# Patient Record
Sex: Female | Born: 1990 | Race: White | Hispanic: No | Marital: Married | Smoking: Never smoker
Health system: Southern US, Community
[De-identification: ages and names within clinical notes are randomized; demographics above are authoritative.]

## PROBLEM LIST (undated history)

## (undated) DIAGNOSIS — Z789 Other specified health status: Secondary | ICD-10-CM

## (undated) HISTORY — PX: NO PAST SURGERIES: SHX2092

## (undated) HISTORY — PX: HIP ARTHROPLASTY: SHX981

## (undated) HISTORY — PX: ESOPHAGEAL DILATION: SHX303

---
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
## 2010-04-18 ENCOUNTER — Emergency Department (HOSPITAL_COMMUNITY): Admission: EM | Admit: 2010-04-18 | Discharge: 2010-04-18 | Payer: Self-pay | Admitting: Family Medicine

## 2010-08-24 ENCOUNTER — Encounter: Payer: Self-pay | Admitting: Internal Medicine

## 2010-10-22 LAB — URINALYSIS, ROUTINE W REFLEX MICROSCOPIC
Bilirubin Urine: NEGATIVE
Hgb urine dipstick: NEGATIVE
Protein, ur: NEGATIVE mg/dL
Urobilinogen, UA: 0.2 mg/dL (ref 0.0–1.0)

## 2010-10-22 LAB — POCT I-STAT, CHEM 8
HCT: 35 % — ABNORMAL LOW (ref 36.0–46.0)
Hemoglobin: 11.9 g/dL — ABNORMAL LOW (ref 12.0–15.0)
Potassium: 3.8 mEq/L (ref 3.5–5.1)
Sodium: 138 mEq/L (ref 135–145)

## 2010-10-22 LAB — BASIC METABOLIC PANEL
BUN: 10 mg/dL (ref 6–23)
CO2: 25 mEq/L (ref 19–32)
Calcium: 9.2 mg/dL (ref 8.4–10.5)
Chloride: 105 mEq/L (ref 96–112)
Creatinine, Ser: 0.77 mg/dL (ref 0.4–1.2)
Glucose, Bld: 82 mg/dL (ref 70–99)

## 2010-10-22 LAB — CBC
MCHC: 34.4 g/dL (ref 30.0–36.0)
MCV: 86.1 fL (ref 78.0–100.0)
RBC: 4.05 MIL/uL (ref 3.87–5.11)
RDW: 14.1 % (ref 11.5–15.5)

## 2010-10-22 LAB — DIFFERENTIAL
Basophils Absolute: 0 10*3/uL (ref 0.0–0.1)
Basophils Relative: 0 % (ref 0–1)
Eosinophils Absolute: 0.3 10*3/uL (ref 0.0–0.7)
Monocytes Relative: 7 % (ref 3–12)
Neutro Abs: 4.8 10*3/uL (ref 1.7–7.7)
Neutrophils Relative %: 63 % (ref 43–77)

## 2010-10-22 LAB — WET PREP, GENITAL: Trich, Wet Prep: NONE SEEN

## 2010-10-22 LAB — GC/CHLAMYDIA PROBE AMP, GENITAL: Chlamydia, DNA Probe: POSITIVE — AB

## 2013-07-18 LAB — OB RESULTS CONSOLE GC/CHLAMYDIA
Chlamydia: NEGATIVE
GC PROBE AMP, GENITAL: NEGATIVE

## 2013-08-03 NOTE — L&D Delivery Note (Signed)
Delivery Note Patient was complete/ Complete / +2 and pushed for 10 minutes.  At 4:43 PM a viable and healthy female was delivered via Vaginal, Spontaneous Delivery (Presentation: Right Occiput Anterior).  APGAR: 8, 9; weight .   Placenta status: Intact, Spontaneous.  Cord: 3 vessels with the following complications: None.   Anesthesia: None  Episiotomy: None Lacerations: 2nd degree Suture Repair: 2.0 vicryl Est. Blood Loss (mL): 400mL  Mom to postpartum.  Baby to Couplet care / Skin to Skin.  Essie HartINN, Shavon Zenz [name] 02/09/2014, 5:23 PM

## 2013-09-11 LAB — OB RESULTS CONSOLE HIV ANTIBODY (ROUTINE TESTING): HIV: NONREACTIVE

## 2013-09-11 LAB — OB RESULTS CONSOLE RUBELLA ANTIBODY, IGM: RUBELLA: IMMUNE

## 2013-09-11 LAB — OB RESULTS CONSOLE HEPATITIS B SURFACE ANTIGEN: HEP B S AG: NEGATIVE

## 2013-09-11 LAB — OB RESULTS CONSOLE RPR: RPR: NONREACTIVE

## 2013-12-06 LAB — OB RESULTS CONSOLE ABO/RH: RH Type: NEGATIVE

## 2013-12-06 LAB — OB RESULTS CONSOLE ANTIBODY SCREEN: ANTIBODY SCREEN: NEGATIVE

## 2014-02-08 ENCOUNTER — Encounter (HOSPITAL_COMMUNITY): Payer: Self-pay | Admitting: *Deleted

## 2014-02-08 ENCOUNTER — Inpatient Hospital Stay (HOSPITAL_COMMUNITY)
Admission: AD | Admit: 2014-02-08 | Discharge: 2014-02-11 | DRG: 774 | Disposition: A | Discharge status: Home / Self Care | Payer: BC Managed Care – PPO | Source: Ambulatory Visit | Attending: Obstetrics & Gynecology | Admitting: Obstetrics & Gynecology

## 2014-02-08 DIAGNOSIS — Z3490 Encounter for supervision of normal pregnancy, unspecified, unspecified trimester: Secondary | ICD-10-CM

## 2014-02-08 DIAGNOSIS — IMO0002 Reserved for concepts with insufficient information to code with codable children: Principal | ICD-10-CM | POA: Diagnosis present

## 2014-02-08 DIAGNOSIS — IMO0001 Reserved for inherently not codable concepts without codable children: Secondary | ICD-10-CM

## 2014-02-08 HISTORY — DX: Other specified health status: Z78.9

## 2014-02-08 LAB — CBC
HEMATOCRIT: 31.8 % — AB (ref 36.0–46.0)
Hemoglobin: 10.2 g/dL — ABNORMAL LOW (ref 12.0–15.0)
MCH: 27.3 pg (ref 26.0–34.0)
MCHC: 32.1 g/dL (ref 30.0–36.0)
MCV: 85 fL (ref 78.0–100.0)
PLATELETS: 132 10*3/uL — AB (ref 150–400)
RBC: 3.74 MIL/uL — ABNORMAL LOW (ref 3.87–5.11)
RDW: 14.9 % (ref 11.5–15.5)
WBC: 9.3 10*3/uL (ref 4.0–10.5)

## 2014-02-08 LAB — OB RESULTS CONSOLE GBS: STREP GROUP B AG: NEGATIVE

## 2014-02-08 LAB — GROUP B STREP BY PCR: Group B strep by PCR: NEGATIVE

## 2014-02-08 MED ORDER — OXYTOCIN 40 UNITS IN LACTATED RINGERS INFUSION - SIMPLE MED: 62.5000 mL/h | INTRAVENOUS | Status: DC

## 2014-02-08 MED ORDER — LACTATED RINGERS IV SOLN
INTRAVENOUS | Status: DC
  Administered 2014-02-08 – 2014-02-09 (×3): via INTRAVENOUS

## 2014-02-08 MED ORDER — IBUPROFEN 600 MG PO TABS
600.0000 mg | ORAL_TABLET | Freq: Four times a day (QID) | ORAL | Status: DC | PRN
  Filled 2014-02-08: qty 1

## 2014-02-08 MED ORDER — PROMETHAZINE HCL 25 MG/ML IJ SOLN: 12.5000 mg | Freq: Four times a day (QID) | INTRAMUSCULAR | Status: DC | PRN

## 2014-02-08 MED ORDER — BUTORPHANOL TARTRATE 1 MG/ML IJ SOLN: 2.0000 mg | INTRAMUSCULAR | Status: DC | PRN

## 2014-02-08 MED ORDER — LIDOCAINE HCL (PF) 1 % IJ SOLN
30.0000 mL | INTRAMUSCULAR | Status: AC | PRN
  Administered 2014-02-09: 30 mL via SUBCUTANEOUS
  Filled 2014-02-08: qty 30

## 2014-02-08 MED ORDER — FLEET ENEMA 7-19 GM/118ML RE ENEM: 1.0000 | ENEMA | RECTAL | Status: DC | PRN

## 2014-02-08 MED ORDER — CITRIC ACID-SODIUM CITRATE 334-500 MG/5ML PO SOLN: 30.0000 mL | ORAL | Status: DC | PRN

## 2014-02-08 MED ORDER — ZOLPIDEM TARTRATE 5 MG PO TABS
5.0000 mg | ORAL_TABLET | Freq: Every evening | ORAL | Status: DC | PRN
  Administered 2014-02-08: 5 mg via ORAL
  Filled 2014-02-08: qty 1

## 2014-02-08 MED ORDER — MISOPROSTOL 25 MCG QUARTER TABLET
25.0000 ug | ORAL_TABLET | ORAL | Status: DC | PRN
  Administered 2014-02-08 (×2): 25 ug via VAGINAL
  Filled 2014-02-08: qty 0.25
  Filled 2014-02-08: qty 1
  Filled 2014-02-08: qty 0.25

## 2014-02-08 MED ORDER — ONDANSETRON HCL 4 MG/2ML IJ SOLN
4.0000 mg | Freq: Four times a day (QID) | INTRAMUSCULAR | Status: DC | PRN
  Administered 2014-02-09: 4 mg via INTRAVENOUS
  Filled 2014-02-08: qty 2

## 2014-02-08 MED ORDER — ACETAMINOPHEN 325 MG PO TABS: 650.0000 mg | ORAL_TABLET | ORAL | Status: DC | PRN

## 2014-02-08 MED ORDER — TERBUTALINE SULFATE 1 MG/ML IJ SOLN
0.2500 mg | Freq: Once | INTRAMUSCULAR | Status: AC | PRN
Start: 2014-02-08 — End: 2014-02-08

## 2014-02-08 MED ORDER — LACTATED RINGERS IV SOLN: 500.0000 mL | INTRAVENOUS | Status: DC | PRN

## 2014-02-08 MED ORDER — OXYCODONE-ACETAMINOPHEN 5-325 MG PO TABS: 1.0000 | ORAL_TABLET | ORAL | Status: DC | PRN

## 2014-02-08 MED ORDER — OXYTOCIN BOLUS FROM INFUSION: 500.0000 mL | INTRAVENOUS | Status: DC

## 2014-02-08 NOTE — H&P (Signed)
Pt is a 23 y/o white female G1P0 at 37 weeks who is admitted for an induction for preeclampsia. PNC was uncomplicated until this week when she presented to the office and was found to have an elevated B/P, edema, and proteinuria. She had PIH labs obtained which revealed a plt ct of 128K . Her plt ct at 28 weeks was 147K. She has no headache or vision changes. PMHX: see hollister B/P 138/92 HEENT-wnl ABD-gravid, no RUQ pain FHTs reactive IMP/ IUP at 37 weeks with preeclampsia. ? If plt ct depressed from gest thrombocytopenia or Preeclampsia. Plan/ Will start Induction

## 2014-02-09 ENCOUNTER — Encounter (HOSPITAL_COMMUNITY): Payer: Self-pay | Admitting: *Deleted

## 2014-02-09 DIAGNOSIS — IMO0001 Reserved for inherently not codable concepts without codable children: Secondary | ICD-10-CM

## 2014-02-09 LAB — RPR

## 2014-02-09 MED ORDER — SENNOSIDES-DOCUSATE SODIUM 8.6-50 MG PO TABS
2.0000 | ORAL_TABLET | ORAL | Status: DC
  Administered 2014-02-10 – 2014-02-11 (×2): 2 via ORAL
  Filled 2014-02-09 (×2): qty 2

## 2014-02-09 MED ORDER — FENTANYL CITRATE 0.05 MG/ML IJ SOLN
100.0000 ug | Freq: Once | INTRAMUSCULAR | Status: AC
  Administered 2014-02-09: 100 ug via INTRAVENOUS

## 2014-02-09 MED ORDER — LACTATED RINGERS IV SOLN: INTRAVENOUS | Status: DC

## 2014-02-09 MED ORDER — RHO D IMMUNE GLOBULIN 1500 UNIT/2ML IJ SOSY
300.0000 ug | PREFILLED_SYRINGE | Freq: Once | INTRAMUSCULAR | Status: AC
  Administered 2014-02-10: 300 ug via INTRAMUSCULAR
  Filled 2014-02-09: qty 2

## 2014-02-09 MED ORDER — OXYCODONE-ACETAMINOPHEN 5-325 MG PO TABS
1.0000 | ORAL_TABLET | ORAL | Status: DC | PRN
  Administered 2014-02-10: 1 via ORAL
  Filled 2014-02-09: qty 1

## 2014-02-09 MED ORDER — LANOLIN HYDROUS EX OINT: TOPICAL_OINTMENT | CUTANEOUS | Status: DC | PRN

## 2014-02-09 MED ORDER — ONDANSETRON HCL 4 MG/2ML IJ SOLN: 4.0000 mg | INTRAMUSCULAR | Status: DC | PRN

## 2014-02-09 MED ORDER — OXYTOCIN 40 UNITS IN LACTATED RINGERS INFUSION - SIMPLE MED
1.0000 m[IU]/min | INTRAVENOUS | Status: DC
  Administered 2014-02-09: 2 m[IU]/min via INTRAVENOUS
  Filled 2014-02-09: qty 1000

## 2014-02-09 MED ORDER — FENTANYL CITRATE 0.05 MG/ML IJ SOLN
50.0000 ug | INTRAMUSCULAR | Status: DC | PRN
  Administered 2014-02-09 (×2): 50 ug via INTRAVENOUS
  Filled 2014-02-09 (×3): qty 2

## 2014-02-09 MED ORDER — TETANUS-DIPHTH-ACELL PERTUSSIS 5-2.5-18.5 LF-MCG/0.5 IM SUSP
0.5000 mL | Freq: Once | INTRAMUSCULAR | Status: DC
  Filled 2014-02-09: qty 0.5

## 2014-02-09 MED ORDER — ONDANSETRON HCL 4 MG PO TABS: 4.0000 mg | ORAL_TABLET | ORAL | Status: DC | PRN

## 2014-02-09 MED ORDER — ZOLPIDEM TARTRATE 5 MG PO TABS: 5.0000 mg | ORAL_TABLET | Freq: Every evening | ORAL | Status: DC | PRN

## 2014-02-09 MED ORDER — PRENATAL MULTIVITAMIN CH
1.0000 | ORAL_TABLET | Freq: Every day | ORAL | Status: DC
  Administered 2014-02-10 – 2014-02-11 (×2): 1 via ORAL
  Filled 2014-02-09 (×2): qty 1

## 2014-02-09 MED ORDER — IBUPROFEN 600 MG PO TABS
600.0000 mg | ORAL_TABLET | Freq: Four times a day (QID) | ORAL | Status: DC
  Administered 2014-02-09 – 2014-02-11 (×7): 600 mg via ORAL
  Filled 2014-02-09 (×6): qty 1

## 2014-02-09 MED ORDER — SIMETHICONE 80 MG PO CHEW: 80.0000 mg | CHEWABLE_TABLET | ORAL | Status: DC | PRN

## 2014-02-09 MED ORDER — BENZOCAINE-MENTHOL 20-0.5 % EX AERO
1.0000 "application " | INHALATION_SPRAY | CUTANEOUS | Status: DC | PRN
  Administered 2014-02-10: 1 via TOPICAL
  Filled 2014-02-09: qty 56

## 2014-02-09 MED ORDER — WITCH HAZEL-GLYCERIN EX PADS: 1.0000 "application " | MEDICATED_PAD | CUTANEOUS | Status: DC | PRN

## 2014-02-09 MED ORDER — DIBUCAINE 1 % RE OINT: 1.0000 "application " | TOPICAL_OINTMENT | RECTAL | Status: DC | PRN

## 2014-02-09 MED ORDER — DIPHENHYDRAMINE HCL 25 MG PO CAPS: 25.0000 mg | ORAL_CAPSULE | Freq: Four times a day (QID) | ORAL | Status: DC | PRN

## 2014-02-09 NOTE — Progress Notes (Signed)
Mary Dickerson is a 23 y.o. G1P0 at [redacted]w[redacted]d by LMP admitted for induction of labor due to Pre-eclamptic toxemia of pregnancy..  Subjective: Patient in pain, she is going to have natural labor  Objective: BP 126/80  Pulse 81  Temp(Src) 98 F (36.7 C) (Oral)  Resp 20  Ht 5' (1.524 m)  Wt 96.163 kg (212 lb)  BMI 41.40 kg/m2  LMP 05/26/2013      FHT:  FHR: 140 bpm, variability: moderate,  accelerations:  Present,  decelerations:  Absent UC:   regular, every 1-4 minutes SVE:   Dilation: 6 Effacement (%): 100 Station: 0 Exam by:: Dr. Mora ApplPinn  Labs: Lab Results  Component Value Date   WBC 9.3 02/08/2014   HGB 10.2* 02/08/2014   HCT 31.8* 02/08/2014   MCV 85.0 02/08/2014   PLT 132* 02/08/2014    Assessment / Plan: Induction of labor due to preeclampsia,  progressing well on pitocin  Labor: Progressing on Pitocin, will continue to increase  Preeclampsia:  no signs or symptoms of toxicity Fetal Wellbeing:  Category I Pain Control:  Fentanyl I/D:  n/a Anticipated MOD:  NSVD  Mary Dickerson [name] 02/09/2014, 2:22 PM

## 2014-02-09 NOTE — Progress Notes (Signed)
Mary Dickerson is a 23 y.o. G1P0 at 3524w0d by LMP admitted for induction of labor due to Pre-eclamptic toxemia of pregnancy..  Subjective: Patient feels urge to push  Objective: BP 126/72  Pulse 70  Temp(Src) 98.2 F (36.8 C) (Oral)  Resp 20  Ht 5' (1.524 m)  Wt 96.163 kg (212 lb)  BMI 41.40 kg/m2  LMP 05/26/2013      FHT:  FHR: 150 bpm, variability: moderate,  accelerations:  Present,  decelerations:  Present variable UC:   regular, every 2-3 minutes SVE:   Dilation: Lip/rim Effacement (%): 100 Station: +1 Exam by:: k fields, rn  Labs: Lab Results  Component Value Date   WBC 9.3 02/08/2014   HGB 10.2* 02/08/2014   HCT 31.8* 02/08/2014   MCV 85.0 02/08/2014   PLT 132* 02/08/2014    Assessment / Plan: Induction of labor due to preeclampsia,  progressing well on pitocin  Labor: Progressing on Pitocin, Patient almost in second stage Will labor down  Preeclampsia:  no signs or symptoms of toxicity Fetal Wellbeing:  Category II Pain Control:  Labor support without medications I/D:  n/a Anticipated MOD:  NSVD  Meshach Perry [name] 02/09/2014, 4:20 PM

## 2014-02-09 NOTE — Progress Notes (Signed)
Mary Dickerson is a 23 y.o. G1P0 at 1932w0d by LMP admitted for induction of labor due to Pre-eclamptic toxemia of pregnancy..  Subjective: Patient starting to feel uncomfortable  Objective: BP 126/92  Pulse 87  Temp(Src) 97.4 F (36.3 C) (Oral)  Resp 18  Ht 5' (1.524 m)  Wt 96.163 kg (212 lb)  BMI 41.40 kg/m2  LMP 05/26/2013      FHT:  FHR: 150 bpm, variability: moderate,  accelerations:  Present,  decelerations:  Absent UC:   irregular, every 3-4 minutes SVE:   Dilation: 1.5 Effacement (%): 50 Station: -3 Exam by:: Dr. Mora ApplPinn  Labs: Lab Results  Component Value Date   WBC 9.3 02/08/2014   HGB 10.2* 02/08/2014   HCT 31.8* 02/08/2014   MCV 85.0 02/08/2014   PLT 132* 02/08/2014    Assessment / Plan: Induction of labor due to preeclampsia,  progressing well on pitocin  Labor: early labor Preeclampsia:  no signs or symptoms of toxicity and intake and ouput balanced will re-check tox labs Fetal Wellbeing:  Category I Pain Control:  Labor support without medications I/D:  n/a Anticipated MOD:  NSVD  Ferry Matthis [name] 02/09/2014, 8:41 AM

## 2014-02-10 LAB — CBC
HCT: 27.1 % — ABNORMAL LOW (ref 36.0–46.0)
Hemoglobin: 8.6 g/dL — ABNORMAL LOW (ref 12.0–15.0)
MCH: 27.5 pg (ref 26.0–34.0)
MCHC: 31.7 g/dL (ref 30.0–36.0)
MCV: 86.6 fL (ref 78.0–100.0)
Platelets: 127 10*3/uL — ABNORMAL LOW (ref 150–400)
RBC: 3.13 MIL/uL — ABNORMAL LOW (ref 3.87–5.11)
RDW: 15.3 % (ref 11.5–15.5)
WBC: 12.6 10*3/uL — ABNORMAL HIGH (ref 4.0–10.5)

## 2014-02-10 NOTE — Lactation Note (Signed)
This note was copied from the chart of Mary ChiropodistAmber Millar. Lactation Consultation Note Mom has flat small nipples. Has #20 and #16 NS, even the #16 looks slightly big. RN just brought in shells and hand pump. Encouraged to pre-pump prior to application of NS, hand expression taught, able to get small amount of colostrum. Encouraged to get colostrum into NS if possible to "prime the line". Baby fussy off the breast, yet will only suck a few times when stimulated. Breast massage and "C" hold encouraged. Went over baby positioning.  Patient Name: Mary Dickerson Mom encouraged to feed baby 8-12 times/24 hours and with feeding cues. Mom reports + breast changes w/pregnancy. Mom encouraged to feed baby w/feeding cues. Encouraged to call for assistance if needed and to verify proper latch.WH/LC brochure given w/resources, support groups and LC services. Educated about newborn behavior. Demonstrated proper latch and an improper latch. Has comfort when baby suckling. Today's Date: 02/10/2014 Reason for consult: Initial assessment   Maternal Data    Feeding Feeding Type: Breast Fed Length of feed: 5 min (still feeding)  LATCH Score/Interventions Latch: Repeated attempts needed to sustain latch, nipple held in mouth throughout feeding, stimulation needed to elicit sucking reflex. Intervention(s): Skin to skin;Teach feeding cues;Waking techniques Intervention(s): Adjust position;Assist with latch;Breast massage;Breast compression  Audible Swallowing: None Intervention(s): Skin to skin;Hand expression Intervention(s): Skin to skin;Hand expression;Alternate breast massage  Type of Nipple: Flat Intervention(s): Shells;Hand pump  Comfort (Breast/Nipple): Soft / non-tender     Hold (Positioning): Assistance needed to correctly position infant at breast and maintain latch. Intervention(s): Breastfeeding basics reviewed;Support Pillows;Position options;Skin to skin  LATCH Score: 5  Lactation Tools  Discussed/Used Tools: Shells;Pump;Nipple Shields Nipple shield size: 16 Shell Type: Sore Breast pump type: Manual   Consult Status Consult Status: Follow-up Date: 02/10/14 Follow-up type: In-patient    [name], [name] 02/10/2014, 9:08 AM

## 2014-02-10 NOTE — Progress Notes (Signed)
Post Partum Day 1 Subjective: no complaints, up ad lib, voiding, tolerating PO and + flatus  Objective: Blood pressure 113/61, pulse 72, temperature 98 F (36.7 C), temperature source Oral, resp. rate 18, height 5' (1.524 m), weight 96.163 kg (212 lb), last menstrual period 05/26/2013, SpO2 100.00%, unknown if currently breastfeeding.  Physical Exam:  General: alert, cooperative and no distress Lochia: appropriate Uterine Fundus: firm DVT Evaluation: No evidence of DVT seen on physical exam. Negative Homan's sign. No cords or calf tenderness.   Recent Labs  02/08/14 1300 02/10/14 0602  HGB 10.2* 8.6*  HCT 31.8* 27.1*    Assessment/Plan: Plan for discharge tomorrow, Breastfeeding and Lactation consult   LOS: 2 days   Mary Dickerson [name] 02/10/2014, 12:47 PM

## 2014-02-11 LAB — RH IG WORKUP (INCLUDES ABO/RH)
ABO/RH(D): AB NEG
Fetal Screen: NEGATIVE
GESTATIONAL AGE(WKS): 37
Unit division: 0

## 2014-02-11 MED ORDER — SENNOSIDES-DOCUSATE SODIUM 8.6-50 MG PO TABS: 2.0000 | ORAL_TABLET | Freq: Two times a day (BID) | ORAL | Status: DC | PRN

## 2014-02-11 MED ORDER — IBUPROFEN 600 MG PO TABS: 600.0000 mg | ORAL_TABLET | Freq: Four times a day (QID) | ORAL | Status: DC | PRN

## 2014-02-11 NOTE — Lactation Note (Signed)
This note was copied from the chart of Mary ChiropodistAmber Depace. Lactation Consultation Note  37 week infant. P1.  Infant in cradle hold upon entering the room with #16 NS. Sucks and a few swallows observed. Reviewed late preterm feeding behavior. Mother has small short shaft nipples that evert with stimulation.   Infant does not sustain latch. Lots of off and on. Reviewed cross cradle hold and fb hold. Had mother hand pump to evert nipple then attempted, baby latched briefly without NS but would not sustain latch. Assisted mother in placing infant in fb hold with NS.  Infant latched sucks and some swallows observed.  After 15 min, infant fell asleep. Reviewed how to wear shells after feeding. Set up DEBP, cleaning and milk storage, foley cup and syringe finger feeding. Plan is for mother to bf then post pump for 15 min 4-6 times a day, hand expressing first. Give baby pumped breastmilk.  Reviewed volume guidelines. Encouraged mother to call for assistance with feeding baby breastmilk.     Patient Name: Mary Lindalou Hosember Rhoads WUJWJ'XToday's Date: 02/11/2014 Reason for consult: Follow-up assessment   Maternal Data    Feeding Feeding Type: Breast Fed Length of feed: 15 min  LATCH Score/Interventions Latch: Repeated attempts needed to sustain latch, nipple held in mouth throughout feeding, stimulation needed to elicit sucking reflex. Intervention(s): Skin to skin Intervention(s): Adjust position;Assist with latch;Breast massage  Audible Swallowing: A few with stimulation Intervention(s): Hand expression Intervention(s): Alternate breast massage  Type of Nipple: Everted at rest and after stimulation Intervention(s): Shells;Hand pump;Double electric pump  Comfort (Breast/Nipple): Filling, red/small blisters or bruises, mild/mod discomfort  Problem noted: Cracked, bleeding, blisters, bruises;Mild/Moderate discomfort Interventions  (Cracked/bleeding/bruising/blister): Expressed breast milk to  nipple Interventions (Mild/moderate discomfort): Hand massage;Hand expression;Post-pump;Breast shields  Hold (Positioning): Assistance needed to correctly position infant at breast and maintain latch.  LATCH Score: 6  Lactation Tools Discussed/Used Tools: Shells;Nipple Shields;Pump;Feeding cup Nipple shield size: 16 Shell Type: Sore Breast pump type: Double-Electric Breast Pump   Consult Status Consult Status: Follow-up Date: 02/12/14 Follow-up type: In-patient    Mary Dickerson, Mary Dickerson 02/11/2014, 11:20 AM

## 2014-02-11 NOTE — Discharge Summary (Signed)
Obstetric Discharge Summary Reason for Admission: induction of labor Prenatal Procedures: none Intrapartum Procedures: spontaneous vaginal delivery Postpartum Procedures: none Complications-Operative and Postpartum: 2nd degree perineal laceration Hemoglobin  Date Value Ref Range Status  02/10/2014 8.6* 12.0 - 15.0 g/dL Final     HCT  Date Value Ref Range Status  02/10/2014 27.1* 36.0 - 46.0 % Final    Physical Exam:  General: alert, cooperative and no distress Lochia: appropriate Uterine Fundus: firm DVT Evaluation: No evidence of DVT seen on physical exam. Negative Homan's sign. No cords or calf tenderness.  Discharge Diagnoses: Term Pregnancy-delivered  Discharge Information: Date: 02/11/2014 Activity: pelvic rest Diet: routine Medications: PNV, Ibuprofen and Colace Condition: stable Instructions: refer to practice specific booklet Discharge to: home   Newborn Data: Live born female  Birth Weight: 6 lb 6.1 oz (2895 g) APGAR: 8, 9  Home with mother.  Essie HartINN, Leslee Haueter [name] 02/11/2014, 10:36 AM

## 2014-02-11 NOTE — Progress Notes (Signed)
Post Partum Day 2 Subjective: no complaints, up ad lib, voiding, tolerating PO, + flatus and breastfeeding  Objective: Blood pressure 101/64, pulse 85, temperature 98 F (36.7 C), temperature source Oral, resp. rate 20, height 5' (1.524 m), weight 96.163 kg (212 lb), last menstrual period 05/26/2013, SpO2 100.00%, unknown if currently breastfeeding.  Physical Exam:  General: alert, cooperative and no distress Lochia: appropriate Uterine Fundus: firm DVT Evaluation: No evidence of DVT seen on physical exam. Negative Homan's sign. No cords or calf tenderness.   Recent Labs  02/08/14 1300 02/10/14 0602  HGB 10.2* 8.6*  HCT 31.8* 27.1*    Assessment/Plan: Discharge home, Breastfeeding and Contraception will discuss at post partum visit   LOS: 3 days   Mary Dickerson [name] 02/11/2014, 10:34 AM

## 2014-02-12 ENCOUNTER — Ambulatory Visit: Payer: Self-pay

## 2014-02-12 LAB — TYPE AND SCREEN
ABO/RH(D): AB NEG
Antibody Screen: POSITIVE
DAT, IgG: NEGATIVE
UNIT DIVISION: 0
Unit division: 0

## 2014-02-12 NOTE — Lactation Note (Signed)
This note was copied from the chart of Mary ChiropodistAmber Vanderburg. Lactation Consultation Note Baby under double photo therapy, is starting to supplement w/formula. Mom has DEBP and is pumping 15 min. W/drops of colostrum.Mom is putting baby to breast then supplementing. Just started supplemtation tonight. Encouraged to call for any concerns.  Patient Name: Mary Dickerson WUJWJ'XToday's Date: 02/12/2014     Maternal Data    Feeding Feeding Type: Breast Fed Length of feed: 20 min  LATCH Score/Interventions                      Lactation Tools Discussed/Used     Consult Status      Mary Dickerson 02/12/2014, 12:11 AM

## 2014-02-12 NOTE — Lactation Note (Signed)
This note was copied from the chart of Girl ChiropodistAmber Twaddle. Lactation Consultation Note  Patient Name: Girl Lindalou Hosember Robledo ZOXWR'UToday's Date: 02/12/2014 Reason for consult: Follow-up assessment Mom reports she has changed to pump and bottle feed. Mom reports she is pumping every 3 hours, starting to received small amount of colostrum. Mom is supplementing with formula while working on getting her milk in. Mom does not have DEBP for home use. She is to call her insurance company this am [facility]. Mom to advise. Stressed importance of consistent pumping to encourage milk production and protect milk supply. Offered to assist with latch if Mom interested, she declined.   Maternal Data    Feeding    LATCH Score/Interventions                      Lactation Tools Discussed/Used     Consult Status Consult Status: Follow-up Date: 02/12/14 Follow-up type: In-patient    Alfred LevinsGranger, Arantxa Piercey Ann 02/12/2014, 11:45 AM

## 2014-06-04 ENCOUNTER — Encounter (HOSPITAL_COMMUNITY): Payer: Self-pay | Admitting: *Deleted

## 2014-09-07 ENCOUNTER — Emergency Department (HOSPITAL_COMMUNITY): Payer: BLUE CROSS/BLUE SHIELD

## 2014-09-07 ENCOUNTER — Encounter (HOSPITAL_COMMUNITY)
Admission: EM | Disposition: A | Discharge status: Home / Self Care | Payer: Self-pay | Source: Home / Self Care | Attending: Emergency Medicine

## 2014-09-07 ENCOUNTER — Encounter (HOSPITAL_COMMUNITY): Payer: Self-pay

## 2014-09-07 ENCOUNTER — Emergency Department (HOSPITAL_COMMUNITY)
Admission: EM | Admit: 2014-09-07 | Discharge: 2014-09-07 | Disposition: A | Discharge status: Home / Self Care | Payer: BLUE CROSS/BLUE SHIELD | Attending: Emergency Medicine | Admitting: Emergency Medicine

## 2014-09-07 DIAGNOSIS — X58XXXA Exposure to other specified factors, initial encounter: Secondary | ICD-10-CM | POA: Diagnosis not present

## 2014-09-07 DIAGNOSIS — T18128A Food in esophagus causing other injury, initial encounter: Secondary | ICD-10-CM | POA: Diagnosis not present

## 2014-09-07 DIAGNOSIS — K222 Esophageal obstruction: Secondary | ICD-10-CM | POA: Diagnosis not present

## 2014-09-07 HISTORY — PX: ESOPHAGOGASTRODUODENOSCOPY: SHX5428

## 2014-09-07 SURGERY — EGD (ESOPHAGOGASTRODUODENOSCOPY)
Anesthesia: Moderate Sedation

## 2014-09-07 MED ORDER — SODIUM CHLORIDE 0.9 % IV BOLUS (SEPSIS)
1000.0000 mL | Freq: Once | INTRAVENOUS | Status: AC
  Administered 2014-09-07: 1000 mL via INTRAVENOUS

## 2014-09-07 MED ORDER — ONDANSETRON HCL 4 MG/2ML IJ SOLN
4.0000 mg | Freq: Once | INTRAMUSCULAR | Status: AC
  Administered 2014-09-07: 4 mg via INTRAVENOUS
  Filled 2014-09-07: qty 2

## 2014-09-07 MED ORDER — MIDAZOLAM HCL 10 MG/2ML IJ SOLN
INTRAMUSCULAR | Status: DC | PRN
  Administered 2014-09-07: 2 mg via INTRAVENOUS
  Administered 2014-09-07: 1 mg via INTRAVENOUS
  Administered 2014-09-07: 2 mg via INTRAVENOUS

## 2014-09-07 MED ORDER — DIPHENHYDRAMINE HCL 50 MG/ML IJ SOLN
INTRAMUSCULAR | Status: AC
  Filled 2014-09-07: qty 1

## 2014-09-07 MED ORDER — FENTANYL CITRATE 0.05 MG/ML IJ SOLN
INTRAMUSCULAR | Status: AC
  Filled 2014-09-07: qty 2

## 2014-09-07 MED ORDER — FENTANYL CITRATE 0.05 MG/ML IJ SOLN
INTRAMUSCULAR | Status: DC | PRN
  Administered 2014-09-07 (×4): 25 ug via INTRAVENOUS

## 2014-09-07 MED ORDER — GLUCAGON HCL RDNA (DIAGNOSTIC) 1 MG IJ SOLR
1.0000 mg | Freq: Once | INTRAMUSCULAR | Status: AC
  Administered 2014-09-07: 1 mg via INTRAVENOUS
  Filled 2014-09-07: qty 1

## 2014-09-07 MED ORDER — MIDAZOLAM HCL 5 MG/ML IJ SOLN
INTRAMUSCULAR | Status: AC
  Filled 2014-09-07: qty 2

## 2014-09-07 NOTE — ED Notes (Signed)
Pt transported to Endoscopy.  Per Endoscopy, they will discharge the pt upstairs.

## 2014-09-07 NOTE — ED Notes (Signed)
Pt presents with onset of mid-sternal chest pain that began today while eating lunch.  Pt reports she was eating chicken, it became lodged, pt has vomited multiple times without relief of bolus.  Pt reports she has drank water, and it immediately comes back up.

## 2014-09-07 NOTE — ED Provider Notes (Signed)
CSN: [id number]     Arrival date & time 09/07/14  1231 History   First MD Initiated Contact with Patient 09/07/14 1418     No chief complaint on file.    (Consider location/radiation/quality/duration/timing/severity/associated sxs/prior Treatment) HPI Comments: Patient with the inability to tolerate her secretions since eating chicken this afternoon.    Patient is a 24 y.o. female presenting with foreign body swallowed. The history is provided by the patient. No language interpreter was used.  Swallowed Foreign Body This is a new problem. The current episode started today. The problem occurs constantly. The problem has been unchanged. Pertinent negatives include no abdominal pain, anorexia, chills, congestion, coughing, fatigue, fever, neck pain, numbness, swollen glands, vomiting or weakness. Nothing aggravates the symptoms. She has tried nothing for the symptoms.    Past Medical History  Diagnosis Date  . Medical history non-contributory    Past Surgical History  Procedure Laterality Date  . No past surgeries     History reviewed. No pertinent family history. History  Substance Use Topics  . Smoking status: Never Smoker   . Smokeless tobacco: Never Used  . Alcohol Use: No   OB History    Gravida Para Term Preterm AB TAB SAB Ectopic Multiple Living   1 1 1       1      Review of Systems  Constitutional: Negative for fever, chills and fatigue.  HENT: Positive for drooling. Negative for congestion.   Respiratory: Negative for cough.   Gastrointestinal: Negative for vomiting, abdominal pain and anorexia.  Musculoskeletal: Negative for neck pain.  Neurological: Negative for weakness and numbness.  All other systems reviewed and are negative.     Allergies  Review of patient's allergies indicates no known allergies.  Home Medications   Prior to Admission medications   Medication Sig Start Date End Date Taking? Authorizing Provider  acetaminophen (TYLENOL) 325 MG tablet  Take 325 mg by mouth every 6 (six) hours as needed for moderate pain.    Yes Historical Provider, MD  etonogestrel (NEXPLANON) 68 MG IMPL implant 1 each by Subdermal route once.   Yes Historical Provider, MD  meloxicam (MOBIC) 15 MG tablet Take 15 mg by mouth at bedtime. 08/17/14  Yes Historical Provider, MD  senna-docusate (SENOKOT-S) 8.6-50 MG per tablet Take 2 tablets by mouth 2 (two) times daily as needed for mild constipation. 02/11/14   Essie HartWalda Pinn, MD   BP 117/54 mmHg  Pulse 83  Temp(Src) 99.5 F (37.5 C) (Oral)  Resp 19  Ht 5' (1.524 m)  Wt 213 lb (96.616 kg)  BMI 41.60 kg/m2  SpO2 99%  LMP 08/13/2014 (Exact Date)  Breastfeeding? No Physical Exam  Constitutional: She is oriented to person, place, and time. She appears well-developed and well-nourished. No distress.  HENT:  Head: Normocephalic and atraumatic.  Drooling and vomiting  Eyes: Pupils are equal, round, and reactive to light.  Neck: Normal range of motion.  Cardiovascular: Normal rate, regular rhythm, normal heart sounds and intact distal pulses.   Pulmonary/Chest: Effort normal. No respiratory distress. She has no wheezes. She exhibits no tenderness.  Abdominal: Soft. Bowel sounds are normal. She exhibits no distension. There is no tenderness. There is no rebound and no guarding.  Neurological: She is alert and oriented to person, place, and time. She has normal strength. No cranial nerve deficit or sensory deficit. She exhibits normal muscle tone. Coordination and gait normal.  Skin: Skin is warm and dry.  Nursing note and vitals reviewed.  ED Course  Procedures (including critical care time) Labs Review Labs Reviewed - No data to display  Imaging Review Dg Chest 2 View  09/07/2014   CLINICAL DATA:  Question chicken bone stuck in mid esophagus today at lunch  EXAM: CHEST  2 VIEW  COMPARISON:  04/18/2010  FINDINGS: Cardiomediastinal silhouette is stable. No acute infiltrate or pleural effusion. No pulmonary edema.  No radiopaque foreign body is identified.  IMPRESSION: No active cardiopulmonary disease.   Electronically Signed   By: Natasha Mead M.D.   On: 09/07/2014 13:52     EKG Interpretation None      MDM   Final diagnoses:  Food impaction of esophagus, initial encounter   Patient is a 24 year old Caucasian female with no pertinent past medical history who comes to the emergency department today with the sensation that chicken is stuck in her throat.  Physical exam as above.  Patient is unable to tolerate her own secretions this is concerning for a food impaction.  Patient was treated with glucagon and Zofran with no improvement in her symptoms as a result GI was consulted.  They will take the patient to the sedation suite for endoscopy.  Patient was transferred to the endoscopy suite in good condition.  Imaging revealed by myself and considered in medical decision making.  Imaging was interpreted by radiology.  Care was discussed with my Attending Dr. Rosalia Hammers.      Bethann Berkshire, MD 09/07/14 4098  Hilario Quarry, MD 09/09/14 1191

## 2014-09-07 NOTE — Op Note (Signed)
[facility] [address] [place], [id number]   ENDOSCOPY PROCERexene EdisonURE REPORT  PATIENT: Mary DossBaxter, Mary Dickerson  MR#: [id number] BIRTHDATE: Nov 23, 1990 , 23  yrs. old GENDER: female ENDOSCOPIST: Dorena CookeyJohn Otto Caraway, MD REFERRED BY: PROCEDURE DATE:  09/07/2014 PROCEDURE: ASA CLASS: INDICATIONS:  esophageal obstruction MEDICATIONS: 50 g fentanyl, 5 mg Versed TOPICAL ANESTHETIC: Cetacaine spray  DESCRIPTION OF PROCEDURE: After the risks benefits and alternatives of the procedure were thoroughly explained, informed consent was obtained.  The Pentax Gastroscope [id number] endoscope was introduced through the mouth and advanced to the second portion of the duodenum , Without limitations.  The instrument was slowly withdrawn as the mucosa was fully examined.    distal lower esophageal ring. There was slight inflammation. There was a bolus of food overlying the ring that was easily pushed into the stomach. there is a fair amount of food in the stomach and the patient was somewhat combative and coughing and a decided not to do a full examination of the stomach or duodenum             The scope was then withdrawn from the patient and the procedure completed.  COMPLICATIONS: There were no immediate complications.  ENDOSCOPIC IMPRESSION: food impactionof the esophagus, relieved endoscopically  RECOMMENDATIONS: follow-up in my office in 2-3 weeks to set up elective dilatation.  REPEAT EXAM:  eSigned:  Dorena CookeyJohn Guerline Happ, MD 09/07/2014 5:38 PM    CC:  CPT CODES: ICD CODES:  The ICD and CPT codes recommended by this software are interpretations from the data that the clinical staff has captured with the software.  The verification of the translation of this report to the ICD and CPT codes and modifiers is the sole responsibility of the health care institution and practicing physician where this report was generated.  [employer] will not be held responsible  for the validity of the ICD and CPT codes included on this report.  AMA assumes no liability for data contained or not contained herein. CPT is a Publishing rights managerregistered trademark of the [employer].

## 2014-09-07 NOTE — H&P (Signed)
    [facility] Consult Note  Referring Provider: No ref. provider found Primary Care Physician:  Ronnie DossWILLARD,JENNIFER, PA-C Primary Gastroenterologist:  Dr.  Antony Contrashief Complaint: Dysphagia HPI: Mary Dickerson is an 24 y.o. white female  who presents after eating chicken for lunch today and feeling the sensation of esophageal obstruction with inability to clear her secretions or swallow any liquids. She denies any dyspnea or chest pain. She's had several fleeting episodes of solid food dysphagia before which have resolved on their own. She does admit to acid reflux for which she takes Tums when necessary.  Past Medical History  Diagnosis Date  . Medical history non-contributory     Past Surgical History  Procedure Laterality Date  . No past surgeries      Medications Prior to Admission  Medication Sig Dispense Refill  . acetaminophen (TYLENOL) 325 MG tablet Take 325 mg by mouth every 6 (six) hours as needed for moderate pain.     Marland Kitchen. etonogestrel (NEXPLANON) 68 MG IMPL implant 1 each by Subdermal route once.    . meloxicam (MOBIC) 15 MG tablet Take 15 mg by mouth at bedtime.  1  . senna-docusate (SENOKOT-S) 8.6-50 MG per tablet Take 2 tablets by mouth 2 (two) times daily as needed for mild constipation. 90 tablet 3    Allergies: No Known Allergies  History reviewed. No pertinent family history.  Social History:  reports that she has never smoked. She has never used smokeless tobacco. She reports that she does not drink alcohol or use illicit drugs.  Review of Systems: negative except as above  Blood pressure 122/46, pulse 87, temperature 98.3 F (36.8 C), temperature source Oral, resp. rate 27, height 5' (1.524 m), weight 96.616 kg (213 lb), last menstrual period 08/13/2014, SpO2 100 %, not currently breastfeeding. Head: Normocephalic, without obvious abnormality, atraumatic Neck: no adenopathy, no carotid bruit, no JVD, supple, symmetrical, trachea midline and thyroid not  enlarged, symmetric, no tenderness/mass/nodules Resp: clear to auscultation bilaterally Cardio: regular rate and rhythm, S1, S2 normal, no murmur, click, rub or gallop GI: Abdomen soft nondistended with normoactive bowel sounds. No paraspinal megaly masses or guarding  Extremities: extremities normal, atraumatic, no cyanosis or edema  No results found for this or any previous visit (from the past 48 hour(s)). Dg Chest 2 View  09/07/2014   CLINICAL DATA:  Question chicken bone stuck in mid esophagus today at lunch  EXAM: CHEST  2 VIEW  COMPARISON:  04/18/2010  FINDINGS: Cardiomediastinal silhouette is stable. No acute infiltrate or pleural effusion. No pulmonary edema. No radiopaque foreign body is identified.  IMPRESSION: No active cardiopulmonary disease.   Electronically Signed   By: Natasha MeadLiviu  Pop M.D.   On: 09/07/2014 13:52    Assessment: Acute onset of sensation of esophageal obstruction consistent with food impaction, not resolved by glucose given in the ER  Plan:  Will proceed with EGD within attempted removal of esophageal foreign body. Risks rationale and alternatives were explained to the patient she wishes to proceed.  Johnetta Sloniker C 09/07/2014, 5:20 PM

## 2014-09-10 ENCOUNTER — Encounter (HOSPITAL_COMMUNITY): Payer: Self-pay | Admitting: Gastroenterology

## 2015-08-27 ENCOUNTER — Encounter (HOSPITAL_COMMUNITY): Payer: Self-pay | Admitting: *Deleted

## 2015-08-27 ENCOUNTER — Emergency Department (HOSPITAL_COMMUNITY): Payer: 59

## 2015-08-27 ENCOUNTER — Emergency Department (HOSPITAL_COMMUNITY)
Admission: EM | Admit: 2015-08-27 | Discharge: 2015-08-27 | Disposition: A | Discharge status: Home / Self Care | Payer: 59 | Attending: Emergency Medicine | Admitting: Emergency Medicine

## 2015-08-27 DIAGNOSIS — Z3202 Encounter for pregnancy test, result negative: Secondary | ICD-10-CM | POA: Diagnosis not present

## 2015-08-27 DIAGNOSIS — K529 Noninfective gastroenteritis and colitis, unspecified: Secondary | ICD-10-CM | POA: Insufficient documentation

## 2015-08-27 DIAGNOSIS — R109 Unspecified abdominal pain: Secondary | ICD-10-CM | POA: Diagnosis present

## 2015-08-27 DIAGNOSIS — R42 Dizziness and giddiness: Secondary | ICD-10-CM | POA: Diagnosis not present

## 2015-08-27 LAB — HEPATIC FUNCTION PANEL
ALBUMIN: 4 g/dL (ref 3.5–5.0)
ALK PHOS: 42 U/L (ref 38–126)
ALT: 21 U/L (ref 14–54)
AST: 24 U/L (ref 15–41)
BILIRUBIN TOTAL: 0.4 mg/dL (ref 0.3–1.2)
Bilirubin, Direct: <0.1 mg/dL — ABNORMAL LOW (ref 0.1–0.5)
TOTAL PROTEIN: 7.6 g/dL (ref 6.5–8.1)

## 2015-08-27 LAB — CBC WITH DIFFERENTIAL/PLATELET
BASOS ABS: 0 10*3/uL (ref 0.0–0.1)
BASOS PCT: 0 %
EOS PCT: 1 %
Eosinophils Absolute: 0 10*3/uL (ref 0.0–0.7)
HEMATOCRIT: 37.4 % (ref 36.0–46.0)
Hemoglobin: 12 g/dL (ref 12.0–15.0)
Lymphocytes Relative: 24 %
Lymphs Abs: 1.4 10*3/uL (ref 0.7–4.0)
MCH: 26.1 pg (ref 26.0–34.0)
MCHC: 32.1 g/dL (ref 30.0–36.0)
MCV: 81.5 fL (ref 78.0–100.0)
MONO ABS: 0.4 10*3/uL (ref 0.1–1.0)
MONOS PCT: 7 %
NEUTROS ABS: 4 10*3/uL (ref 1.7–7.7)
Neutrophils Relative %: 68 %
Platelets: 201 10*3/uL (ref 150–400)
RBC: 4.59 MIL/uL (ref 3.87–5.11)
RDW: 14.8 % (ref 11.5–15.5)
WBC: 5.8 10*3/uL (ref 4.0–10.5)

## 2015-08-27 LAB — BASIC METABOLIC PANEL
ANION GAP: 8 (ref 5–15)
BUN: 10 mg/dL (ref 6–20)
CALCIUM: 8.9 mg/dL (ref 8.9–10.3)
CO2: 22 mmol/L (ref 22–32)
CREATININE: 0.71 mg/dL (ref 0.44–1.00)
Chloride: 106 mmol/L (ref 101–111)
GFR calc Af Amer: >60 mL/min (ref >60)
Glucose, Bld: 98 mg/dL (ref 65–99)
Potassium: 3.9 mmol/L (ref 3.5–5.1)
Sodium: 136 mmol/L (ref 135–145)

## 2015-08-27 LAB — URINALYSIS, ROUTINE W REFLEX MICROSCOPIC
BILIRUBIN URINE: NEGATIVE
Glucose, UA: NEGATIVE mg/dL
KETONES UR: NEGATIVE mg/dL
Leukocytes, UA: NEGATIVE
NITRITE: NEGATIVE
PH: 5 (ref 5.0–8.0)
PROTEIN: 30 mg/dL — AB
Specific Gravity, Urine: >1.03 — ABNORMAL HIGH (ref 1.005–1.030)

## 2015-08-27 LAB — URINE MICROSCOPIC-ADD ON: WBC UA: NONE SEEN WBC/hpf (ref 0–5)

## 2015-08-27 LAB — PREGNANCY, URINE: PREG TEST UR: NEGATIVE

## 2015-08-27 MED ORDER — SODIUM CHLORIDE 0.9 % IV BOLUS (SEPSIS)
1000.0000 mL | Freq: Once | INTRAVENOUS | Status: AC
  Administered 2015-08-27: 1000 mL via INTRAVENOUS

## 2015-08-27 MED ORDER — ONDANSETRON 4 MG PO TBDP: ORAL_TABLET | ORAL | Status: DC

## 2015-08-27 MED ORDER — KETOROLAC TROMETHAMINE 30 MG/ML IJ SOLN
30.0000 mg | Freq: Once | INTRAMUSCULAR | Status: AC
  Administered 2015-08-27: 30 mg via INTRAVENOUS
  Filled 2015-08-27: qty 1

## 2015-08-27 MED ORDER — PANTOPRAZOLE SODIUM 40 MG IV SOLR
40.0000 mg | Freq: Once | INTRAVENOUS | Status: AC
  Administered 2015-08-27: 40 mg via INTRAVENOUS
  Filled 2015-08-27: qty 40

## 2015-08-27 MED ORDER — ONDANSETRON HCL 4 MG/2ML IJ SOLN
4.0000 mg | Freq: Once | INTRAMUSCULAR | Status: AC
  Administered 2015-08-27: 4 mg via INTRAVENOUS
  Filled 2015-08-27: qty 2

## 2015-08-27 NOTE — Discharge Instructions (Signed)
Drink plenty of fluids. Follow-up with their family doctor as needed. Follow-up with orthopedic for your left hip

## 2015-08-27 NOTE — ED Provider Notes (Signed)
CSN: [id number]     Arrival date & time 08/27/15  1118 History  By signing my name below, I, Terrance Branch, attest that this documentation has been prepared under the direction and in the presence of Bethann Berkshire, MD. Electronically Signed: Evon Slack, ED Scribe. 08/27/2015. 11:59 AM.    Chief Complaint  Patient presents with  . Abdominal Pain   Patient is a 25 y.o. female presenting with abdominal pain. The history is provided by the patient (Patient complains of vomiting and diarrhea). No language interpreter was used.  Abdominal Pain Pain location:  Generalized Pain quality: aching   Pain radiates to:  Does not radiate Pain severity:  Mild Onset quality:  Sudden Timing:  Intermittent Associated symptoms: diarrhea, nausea and vomiting   Associated symptoms: no chest pain, no cough, no fatigue and no hematuria    HPI Comments: Mary Dickerson is a 25 y.o. female who presents to the Emergency Department complaining of abdominal pain onset 24 hours prior. Pt reports associated n/v/d and dizziness. She states that she has not been able to tolerate any solids or liquids within the last 24 hours. Pt denies any recent sick contacts. Pt doesn't report any medications PTA. Pt denies any other related symptoms   Past Medical History  Diagnosis Date  . Medical history non-contributory    Past Surgical History  Procedure Laterality Date  . No past surgeries    . Esophagogastroduodenoscopy N/A 09/07/2014    Procedure: ESOPHAGOGASTRODUODENOSCOPY (EGD);  Surgeon: Barrie Folk, MD;  Location: [facility] ENDOSCOPY;  Service: Endoscopy;  Laterality: N/A;   History reviewed. No pertinent family history. Social History  Substance Use Topics  . Smoking status: Never Smoker   . Smokeless tobacco: Never Used  . Alcohol Use: No   OB History    Gravida Para Term Preterm AB TAB SAB Ectopic Multiple Living   Review of Systems  Constitutional: Negative for appetite change and  fatigue.  HENT: Negative for congestion, ear discharge and sinus pressure.   Eyes: Negative for discharge.  Respiratory: Negative for cough.   Cardiovascular: Negative for chest pain.  Gastrointestinal: Positive for nausea, vomiting, abdominal pain and diarrhea.  Genitourinary: Negative for frequency and hematuria.  Musculoskeletal: Negative for back pain.  Skin: Negative for rash.  Neurological: Positive for dizziness. Negative for seizures and headaches.  Psychiatric/Behavioral: Negative for hallucinations.     Allergies  Review of patient's allergies indicates no known allergies.  Home Medications   Prior to Admission medications   Medication Sig Start Date End Date Taking? Authorizing Provider  acetaminophen (TYLENOL) 325 MG tablet Take 325 mg by mouth every 6 (six) hours as needed for moderate pain.    Yes Historical Provider, MD  etonogestrel (NEXPLANON) 68 MG IMPL implant 1 each by Subdermal route once.   Yes Historical Provider, MD   BP 149/73 mmHg  Pulse 105  Temp(Src) 98.3 F (36.8 C) (Oral)  Resp 18  Ht 5' (1.524 m)  Wt 220 lb (99.791 kg)  BMI 42.97 kg/m2  SpO2 100%  LMP 08/27/2015   Physical Exam  Constitutional: She is oriented to person, place, and time. She appears well-developed.  HENT:  Head: Normocephalic.  Eyes: Conjunctivae and EOM are normal. No scleral icterus.  Neck: Neck supple. No thyromegaly present.  Cardiovascular: Normal rate and regular rhythm.  Exam reveals no gallop and no friction rub.   No murmur heard. Pulmonary/Chest: No  stridor. She has no wheezes. She has no rales. She exhibits no tenderness.  Abdominal: She exhibits no distension. There is tenderness in the epigastric area. There is no rebound.  Mild epigastric tenderness.   Musculoskeletal: Normal range of motion. She exhibits no edema.  Lymphadenopathy:    She has no cervical adenopathy.  Neurological: She is oriented to person, place, and time. She exhibits normal muscle tone.  Coordination normal.  Skin: No rash noted. No erythema.  Psychiatric: She has a normal mood and affect. Her behavior is normal.    ED Course  Procedures (including critical care time) DIAGNOSTIC STUDIES: Oxygen Saturation is 100% on RA, normal by my interpretation.    COORDINATION OF CARE: 11:59 AM-Discussed treatment plan with pt at bedside and pt agreed to plan.     Labs Review Labs Reviewed  URINALYSIS, ROUTINE W REFLEX MICROSCOPIC (NOT AT [facility])  CBC WITH DIFFERENTIAL/PLATELET  BASIC METABOLIC PANEL  URINALYSIS, ROUTINE W REFLEX MICROSCOPIC (NOT AT [facility])    Imaging Review No results found.    EKG Interpretation None      MDM   Final diagnoses:  None      Patient with gastroenteritis she'll be sent home with Zofran and she feels better with the liter of fluids. 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 She will be referred to orthopedics  The chart was scribed for me under my direct supervision.  I personally performed the history, physical, and medical decision making and all procedures in the evaluation of this patient.Bethann Berkshire, MD 08/27/15 [phone]

## 2015-08-27 NOTE — ED Notes (Signed)
Pt. Reports abd pain with n/v/d since yesterday. Fever yesterday of 102. Sent over from urgent care.

## 2015-08-27 NOTE — ED Notes (Signed)
1 liter saline bolus started

## 2015-08-27 NOTE — ED Notes (Signed)
Patient with no complaints at this time. Respirations even and unlabored. Skin warm/dry. Discharge instructions reviewed with patient at this time. Patient given opportunity to voice concerns/ask questions. IV removed per policy and band-aid applied to site. Patient discharged at this time and left Emergency Department with steady gait.  

## 2015-08-30 ENCOUNTER — Emergency Department
Admission: EM | Admit: 2015-08-30 | Discharge: 2015-08-30 | Disposition: A | Discharge status: Home / Self Care | Payer: 59 | Attending: Emergency Medicine | Admitting: Emergency Medicine

## 2015-08-30 ENCOUNTER — Emergency Department: Payer: 59

## 2015-08-30 DIAGNOSIS — R1084 Generalized abdominal pain: Secondary | ICD-10-CM | POA: Diagnosis present

## 2015-08-30 DIAGNOSIS — E669 Obesity, unspecified: Secondary | ICD-10-CM | POA: Diagnosis not present

## 2015-08-30 DIAGNOSIS — Z79899 Other long term (current) drug therapy: Secondary | ICD-10-CM | POA: Diagnosis not present

## 2015-08-30 DIAGNOSIS — R109 Unspecified abdominal pain: Secondary | ICD-10-CM

## 2015-08-30 DIAGNOSIS — A084 Viral intestinal infection, unspecified: Secondary | ICD-10-CM | POA: Insufficient documentation

## 2015-08-30 LAB — URINALYSIS COMPLETE WITH MICROSCOPIC (ARMC ONLY)
Bacteria, UA: NONE SEEN
Bilirubin Urine: NEGATIVE
Glucose, UA: NEGATIVE mg/dL
KETONES UR: NEGATIVE mg/dL
LEUKOCYTES UA: NEGATIVE
Nitrite: NEGATIVE
PH: 5 (ref 5.0–8.0)
PROTEIN: NEGATIVE mg/dL
Specific Gravity, Urine: 1.011 (ref 1.005–1.030)

## 2015-08-30 LAB — CBC
HCT: 34.9 % — ABNORMAL LOW (ref 35.0–47.0)
HEMOGLOBIN: 11.4 g/dL — AB (ref 12.0–16.0)
MCH: 25.7 pg — AB (ref 26.0–34.0)
MCHC: 32.5 g/dL (ref 32.0–36.0)
MCV: 79 fL — AB (ref 80.0–100.0)
Platelets: 197 10*3/uL (ref 150–440)
RBC: 4.42 MIL/uL (ref 3.80–5.20)
RDW: 15.2 % — ABNORMAL HIGH (ref 11.5–14.5)
WBC: 4.8 10*3/uL (ref 3.6–11.0)

## 2015-08-30 LAB — COMPREHENSIVE METABOLIC PANEL
ALBUMIN: 3.9 g/dL (ref 3.5–5.0)
ALT: 22 U/L (ref 14–54)
ANION GAP: 8 (ref 5–15)
AST: 26 U/L (ref 15–41)
Alkaline Phosphatase: 40 U/L (ref 38–126)
BUN: 6 mg/dL (ref 6–20)
CHLORIDE: 107 mmol/L (ref 101–111)
CO2: 25 mmol/L (ref 22–32)
Calcium: 8.9 mg/dL (ref 8.9–10.3)
Creatinine, Ser: 0.82 mg/dL (ref 0.44–1.00)
GFR calc non Af Amer: >60 mL/min (ref >60)
GLUCOSE: 93 mg/dL (ref 65–99)
Potassium: 3.9 mmol/L (ref 3.5–5.1)
SODIUM: 140 mmol/L (ref 135–145)
Total Bilirubin: 0.2 mg/dL — ABNORMAL LOW (ref 0.3–1.2)
Total Protein: 7.4 g/dL (ref 6.5–8.1)

## 2015-08-30 LAB — LIPASE, BLOOD: LIPASE: 29 U/L (ref 11–51)

## 2015-08-30 MED ORDER — HYDROCODONE-ACETAMINOPHEN 5-325 MG PO TABS
1.0000 | ORAL_TABLET | Freq: Once | ORAL | Status: AC
  Administered 2015-08-30: 1 via ORAL
  Filled 2015-08-30: qty 1

## 2015-08-30 MED ORDER — HYDROCODONE-ACETAMINOPHEN 5-325 MG PO TABS: 1.0000 | ORAL_TABLET | Freq: Four times a day (QID) | ORAL | Status: DC | PRN

## 2015-08-30 NOTE — ED Notes (Signed)
Pt c/o having N/V with watery diarrhea since Sunday and was seen at [facility] and treated for dehydration, states she still has the N/V and diarrhea but since last night started having right flank pain.Marland Kitchen

## 2015-08-30 NOTE — ED Notes (Addendum)
Pt signed e-signature pad, but it did not save.  Pt ambulatory home.

## 2015-08-30 NOTE — ED Provider Notes (Signed)
Time Seen: Approximately 1148  I have reviewed the triage notes  Chief Complaint: Emesis; Diarrhea; and Flank Pain   History of Present Illness: Mary Dickerson is a 25 y.o. female who presents with some diffuse abdominal pain mainly in the right flank area. The patient's and Sundays been having some nausea, vomiting, and diarrhea. Evaluated in an outside emergency department and diagnosed with gastroenteritis. Patient has been taking Zofran which she states is significantly relieved her nausea and vomiting. She states earlier today this (this morning) she started developing some diffuse abdominal pain but mainly located in the right flank area. She denies any melena or hematochezia. She states she's had 4 loose watery bowel movements today without any signs of blood. She denies any vaginal discharge or bleeding. She denies any dysuria, hematuria, urinary frequency. Patient took over-the-counter Tylenol without relief. Patient denies any C. difficile risk factors such as recent antibiotic therapy, travel, exposure etc.  Past Medical History  Diagnosis Date  . Medical history non-contributory     Patient Active Problem List   Diagnosis Date Noted  . Status post normal vaginal delivery 02/09/2014  . Pregnancy 02/08/2014    Past Surgical History  Procedure Laterality Date  . No past surgeries    . Esophagogastroduodenoscopy N/A 09/07/2014    Procedure: ESOPHAGOGASTRODUODENOSCOPY (EGD);  Surgeon: Barrie Folk, MD;  Location: [facility] ENDOSCOPY;  Service: Endoscopy;  Laterality: N/A;    Past Surgical History  Procedure Laterality Date  . No past surgeries    . Esophagogastroduodenoscopy N/A 09/07/2014    Procedure: ESOPHAGOGASTRODUODENOSCOPY (EGD);  Surgeon: Barrie Folk, MD;  Location: [facility] ENDOSCOPY;  Service: Endoscopy;  Laterality: N/A;    Current Outpatient Rx  Name  Route  Sig  Dispense  Refill  . acetaminophen (TYLENOL) 325 MG tablet   Oral   Take 325 mg by mouth every 6 (six) hours  as needed for moderate pain.          Marland Kitchen etonogestrel (NEXPLANON) 68 MG IMPL implant   Subdermal   1 each by Subdermal route once.         . ondansetron (ZOFRAN ODT) 4 MG disintegrating tablet       ODT q4 hours prn nausea/vomit   12 tablet   0     Allergies:  Review of patient's allergies indicates no known allergies.  Family History: No family history on file.  Social History: Social History  Substance Use Topics  . Smoking status: Never Smoker   . Smokeless tobacco: Never Used  . Alcohol Use: No     Review of Systems:   10 point review of systems was performed and was otherwise negative:  Constitutional: No fever Eyes: No visual disturbances ENT: No sore throat, ear pain Cardiac: No chest pain Respiratory: No shortness of breath, wheezing, or stridor Abdomen: Diffuse abdominal pain but primarily in the right flank area, no current vomiting, she does have loose watery stool Endocrine: No weight loss, No night sweats Extremities: No peripheral edema, cyanosis Skin: No rashes, easy bruising Neurologic: No focal weakness, trouble with speech or swollowing Urologic: No dysuria, Hematuria, or urinary frequency   Physical Exam:  ED Triage Vitals  Enc Vitals Group     BP 08/30/15 1008 116/63 mmHg     Pulse Rate 08/30/15 1008 85     Resp 08/30/15 1008 16     Temp 08/30/15 1008 98.1 F (36.7 C)     Temp Source 08/30/15 1008 Oral  SpO2 08/30/15 1008 98 %     Weight 08/30/15 1008 215 lb (97.523 kg)     Height 08/30/15 1008 5' (1.524 m)     Head Cir --      Peak Flow --      Pain Score 08/30/15 1009 6     Pain Loc --      Pain Edu? --      Excl. in GC? --     General: Awake , Alert , and Oriented times 3; GCS 15 Head: Normal cephalic , atraumatic Eyes: Pupils equal , round, reactive to light Nose/Throat: No nasal drainage, patent upper airway without erythema or exudate.  Neck: Supple, Full range of motion, No anterior adenopathy or palpable  thyroid masses Lungs: Clear to ascultation without wheezes , rhonchi, or rales Heart: Regular rate, regular rhythm without murmurs , gallops , or rubs Abdomen: Obese with mild diffuse tenderness without rebound, guarding , or rigidity; bowel sounds positive and symmetric in all 4 quadrants. No organomegaly .       No focal tenderness over McBurney's point, negative Murphy's sign Extremities: 2 plus symmetric pulses. No edema, clubbing or cyanosis Neurologic: normal ambulation, Motor symmetric without deficits, sensory intact Skin: warm, dry, no rashes   Labs:   All laboratory work was reviewed including any pertinent negatives or positives listed below:  Labs Reviewed  COMPREHENSIVE METABOLIC PANEL - Abnormal; Notable for the following:    Total Bilirubin 0.2 (*)    All other components within normal limits  CBC - Abnormal; Notable for the following:    Hemoglobin 11.4 (*)    HCT 34.9 (*)    MCV 79.0 (*)    MCH 25.7 (*)    RDW 15.2 (*)    All other components within normal limits  URINALYSIS COMPLETEWITH MICROSCOPIC ([facility] ONLY) - Abnormal; Notable for the following:    Color, Urine STRAW (*)    APPearance CLEAR (*)    Hgb urine dipstick 3+ (*)    Squamous Epithelial / LPF 0-5 (*)    All other components within normal limits  LIPASE, BLOOD   review laboratory work shows no significant findings. Review of records from [facility] shows no change   Radiology:       EXAM: CT ABDOMEN AND PELVIS WITHOUT CONTRAST  TECHNIQUE: Multidetector CT imaging of the abdomen and pelvis was performed following the standard protocol without IV contrast.  COMPARISON: None.  FINDINGS: The lung bases are unremarkable. Sagittal images of the spine shows no destructive bony lesions. There is spurring of superior aspect of the left femoral head. Sclerosis and subtle cystic changes are noted left superior acetabulum. Findings are highly suspicious for osteoarthritic changes. Mild  remodeling of superior aspect of the left femoral head. Avascular necrosis cannot be excluded. Clinical correlation is necessary. Mild sclerotic changes right SI joint.  The lung bases are unremarkable. Tiny hiatal hernia. Unenhanced liver shows no biliary ductal dilatation. No calcified gallstones are noted within gallbladder. The unenhanced pancreas, spleen and adrenal glands are unremarkable. Unenhanced kidneys are symmetrical in size. No nephrolithiasis. No hydronephrosis or hydroureter. Multiple mesenteric lymph nodes are noted. The largest in left central mesentery measures 1.3 x 1.3 cm borderline by size criteria. Mesenteric adenitis or reactive adenopathy cannot be excluded.  There is no pericecal inflammation. No small bowel obstruction. No thickened or dilated small bowel loops. The terminal ileum is unremarkable. Normal appendix is partially visualized in coronal image 81.  There is a right ovarian cyst  measures 3.6 cm. Unenhanced uterus is anteflexed normal size. No calcified calculi are noted within urinary bladder. Bilateral distal ureter is unremarkable.  IMPRESSION: 1. There is no nephrolithiasis. No hydronephrosis or hydroureter. 2. Mild enlarged mesenteric lymph nodes. Mesenteric adenitis or reactive adenopathy cannot be excluded. 3. Normal appendix. No pericecal inflammation. 4. No small bowel obstruction. 5. Osteoarthritic changes left hip joint. Question avascular necrosis left femoral head. 6. No calcified ureteral calculi are noted. 7. There is a right ovarian cyst measures 3.6 cm.   I personally reviewed the radiologic studies     ED Course:  Patient's stay here was uneventful and the patient does not appear to have any peritoneal signs or surgical issues noted on physical exam on CAT scan evaluation. She most likely has some diffuse enteritis from a viral gastroenteritis. I felt this was unlikely to be C. difficile but cautioned the patient that if  her diarrhea gets worse, she develops a fever, increased abdominal pain or any other new concerns should return here to emergency department.   Assessment: Viral gastroenteritis   Final Clinical Impression:  * Final diagnoses:  Right flank pain     Plan: * Outpatient management Patient was advised to return immediately if condition worsens. Patient was advised to follow up with their primary care physician or other specialized physicians involved in their outpatient care             Jennye Moccasin, MD 08/30/15 [phone]

## 2015-08-30 NOTE — Discharge Instructions (Signed)
Abdominal Pain, Adult °Many things can cause abdominal pain. Usually, abdominal pain is not caused by a disease and will improve without treatment. It can often be observed and treated at home. Your health care provider will do a physical exam and possibly order blood tests and X-rays to help determine the seriousness of your pain. However, in many cases, more time must pass before a clear cause of the pain can be found. Before that point, your health care provider may not know if you need more testing or further treatment. °HOME CARE INSTRUCTIONS °Monitor your abdominal pain for any changes. The following actions may help to alleviate any discomfort you are experiencing: °· Only take over-the-counter or prescription medicines as directed by your health care provider. °· Do not take laxatives unless directed to do so by your health care provider. °· Try a clear liquid diet (broth, tea, or water) as directed by your health care provider. Slowly move to a bland diet as tolerated. °SEEK MEDICAL CARE IF: °· You have unexplained abdominal pain. °· You have abdominal pain associated with nausea or diarrhea. °· You have pain when you urinate or have a bowel movement. °· You experience abdominal pain that wakes you in the night. °· You have abdominal pain that is worsened or improved by eating food. °· You have abdominal pain that is worsened with eating fatty foods. °· You have a fever. °SEEK IMMEDIATE MEDICAL CARE IF: °· Your pain does not go away within 2 hours. °· You keep throwing up (vomiting). °· Your pain is felt only in portions of the abdomen, such as the right side or the left lower portion of the abdomen. °· You pass bloody or black tarry stools. °MAKE SURE YOU: °· Understand these instructions. °· Will watch your condition. °· Will get help right away if you are not doing well or get worse. °  °This information is not intended to replace advice given to you by your health care provider. Make sure you discuss  any questions you have with your health care provider. °  °Document Released: 04/29/2005 Document Revised: 04/10/2015 Document Reviewed: 03/29/2013 °Elsevier Interactive Patient Education ©2016 [employer] ° ° °Please return immediately if condition worsens. Please contact her primary physician or the physician you were given for referral. If you have any specialist physicians involved in her treatment and plan please also contact them. Thank you for using [facility]. ° °

## 2015-08-30 NOTE — ED Notes (Signed)
G2,P1,A1  No vaginal discharge noted per patient interview

## 2016-10-27 ENCOUNTER — Encounter (HOSPITAL_COMMUNITY): Payer: Self-pay | Admitting: Emergency Medicine

## 2016-10-27 ENCOUNTER — Emergency Department (HOSPITAL_COMMUNITY)
Admission: EM | Admit: 2016-10-27 | Discharge: 2016-10-28 | Disposition: A | Discharge status: Home / Self Care | Payer: BLUE CROSS/BLUE SHIELD | Attending: Emergency Medicine | Admitting: Emergency Medicine

## 2016-10-27 DIAGNOSIS — R1012 Left upper quadrant pain: Secondary | ICD-10-CM | POA: Diagnosis present

## 2016-10-27 DIAGNOSIS — Z79899 Other long term (current) drug therapy: Secondary | ICD-10-CM | POA: Insufficient documentation

## 2016-10-27 LAB — CBC
HEMATOCRIT: 33 % — AB (ref 36.0–46.0)
Hemoglobin: 10.1 g/dL — ABNORMAL LOW (ref 12.0–15.0)
MCH: 24 pg — ABNORMAL LOW (ref 26.0–34.0)
MCHC: 30.6 g/dL (ref 30.0–36.0)
MCV: 78.6 fL (ref 78.0–100.0)
Platelets: 213 10*3/uL (ref 150–400)
RBC: 4.2 MIL/uL (ref 3.87–5.11)
RDW: 17.3 % — AB (ref 11.5–15.5)
WBC: 8.1 10*3/uL (ref 4.0–10.5)

## 2016-10-27 LAB — COMPREHENSIVE METABOLIC PANEL
ALBUMIN: 3.8 g/dL (ref 3.5–5.0)
ALT: 22 U/L (ref 14–54)
AST: 25 U/L (ref 15–41)
Alkaline Phosphatase: 50 U/L (ref 38–126)
Anion gap: 9 (ref 5–15)
BILIRUBIN TOTAL: 0.3 mg/dL (ref 0.3–1.2)
BUN: 10 mg/dL (ref 6–20)
CHLORIDE: 103 mmol/L (ref 101–111)
CO2: 26 mmol/L (ref 22–32)
Calcium: 9.1 mg/dL (ref 8.9–10.3)
Creatinine, Ser: 0.73 mg/dL (ref 0.44–1.00)
GFR calc Af Amer: >60 mL/min (ref >60)
GFR calc non Af Amer: >60 mL/min (ref >60)
GLUCOSE: 100 mg/dL — AB (ref 65–99)
POTASSIUM: 4.1 mmol/L (ref 3.5–5.1)
Sodium: 138 mmol/L (ref 135–145)
TOTAL PROTEIN: 7 g/dL (ref 6.5–8.1)

## 2016-10-27 LAB — URINALYSIS, ROUTINE W REFLEX MICROSCOPIC
BILIRUBIN URINE: NEGATIVE
GLUCOSE, UA: NEGATIVE mg/dL
Hgb urine dipstick: NEGATIVE
Ketones, ur: NEGATIVE mg/dL
Leukocytes, UA: NEGATIVE
NITRITE: NEGATIVE
PH: 6 (ref 5.0–8.0)
Protein, ur: NEGATIVE mg/dL
SPECIFIC GRAVITY, URINE: 1.03 (ref 1.005–1.030)

## 2016-10-27 LAB — LIPASE, BLOOD: Lipase: 16 U/L (ref 11–51)

## 2016-10-27 MED ORDER — GI COCKTAIL ~~LOC~~
30.0000 mL | Freq: Once | ORAL | Status: AC
Start: 2016-10-28 — End: 2016-10-28
  Administered 2016-10-28: 30 mL via ORAL
  Filled 2016-10-27: qty 30

## 2016-10-27 MED ORDER — ACETAMINOPHEN 500 MG PO TABS
1000.0000 mg | ORAL_TABLET | Freq: Once | ORAL | Status: AC
  Administered 2016-10-28: 1000 mg via ORAL
  Filled 2016-10-27: qty 2

## 2016-10-27 NOTE — ED Triage Notes (Signed)
Pt presents with LUQ abd pain, rib cage pain that began just PTA that was sharp in nature; pt also noted a new mass under the L breast; no redness, warmth, or drainage noted

## 2016-10-28 LAB — D-DIMER, QUANTITATIVE: D-Dimer, Quant: 0.47 ug/mL-FEU (ref 0.00–0.50)

## 2016-10-28 LAB — PREGNANCY, URINE: PREG TEST UR: NEGATIVE

## 2016-10-28 MED ORDER — OMEPRAZOLE 20 MG PO CPDR: 20.0000 mg | DELAYED_RELEASE_CAPSULE | Freq: Every day | ORAL | 0 refills | Status: AC

## 2016-10-28 MED ORDER — IBUPROFEN 600 MG PO TABS: 600.0000 mg | ORAL_TABLET | Freq: Four times a day (QID) | ORAL | 0 refills | Status: AC | PRN

## 2016-10-28 MED ORDER — TRAMADOL HCL 50 MG PO TABS
50.0000 mg | ORAL_TABLET | Freq: Once | ORAL | Status: AC
Start: 2016-10-28 — End: 2016-10-28
  Administered 2016-10-28: 50 mg via ORAL
  Filled 2016-10-28: qty 1

## 2016-10-28 MED ORDER — TRAMADOL HCL 50 MG PO TABS: 50.0000 mg | ORAL_TABLET | Freq: Four times a day (QID) | ORAL | 0 refills | Status: AC | PRN

## 2016-10-28 NOTE — ED Notes (Addendum)
Per RN lab will add on D-dimer.

## 2016-10-28 NOTE — ED Notes (Signed)
Pt verbalized understanding of d/c instructions and has no further questions. Pt stable and NAD. VSS. Pt d/c home with sign other driving.

## 2016-10-28 NOTE — Discharge Instructions (Signed)
Take Prilosec to manage heartburn as your symptoms may be associated with reflux. It is also possible that you have a strain to the muscle of your chest/abdominal wall. We recommend ice for this as well as ibuprofen. You may taken Tramadol for severe pain. Follow up with your primary care doctor for further evaluation of symptoms. You may return if symptoms persist or worsen.

## 2016-10-28 NOTE — ED Provider Notes (Signed)
[facility]-EMERGENCY DEPT Provider Note   CSN: [id number] Arrival date & time: 10/27/16  7829    History   Chief Complaint Chief Complaint  Patient presents with  . Mass  . Abdominal Pain    HPI Mary Dickerson is a 26 y.o. female.  26 year old female with no significant past medical history presents to the emergency department for evaluation of left upper quadrant abdominal pain. She states that she has had a cramping discomfort in her upper abdomen over the past 4 days. Symptoms worsened this morning. She states the pain has changed to a sharp pain. She has not taken any medications for her symptoms today. Patient notes difficulty with reflux in the past few days. No associated fever, vomiting, diarrhea, melena, hematochezia, urinary symptoms, or history of abdominal surgeries. She believes that there is a mass under her left breast correlating to site of her discomfort. No recent trauma or associated erythema or drainage.       Past Medical History:  Diagnosis Date  . Medical history non-contributory     Patient Active Problem List   Diagnosis Date Noted  . Status post normal vaginal delivery 02/09/2014  . Pregnancy 02/08/2014    Past Surgical History:  Procedure Laterality Date  . ESOPHAGEAL DILATION    . ESOPHAGOGASTRODUODENOSCOPY N/A 09/07/2014   Procedure: ESOPHAGOGASTRODUODENOSCOPY (EGD);  Surgeon: Barrie Folk, MD;  Location: [facility] ENDOSCOPY;  Service: Endoscopy;  Laterality: N/A;  . HIP ARTHROPLASTY    . NO PAST SURGERIES      OB History    Gravida Para Term Preterm AB Living   1 1 1     1    SAB TAB Ectopic Multiple Live Births           1       Home Medications    Prior to Admission medications   Medication Sig Start Date End Date Taking? Authorizing Provider  etonogestrel (NEXPLANON) 68 MG IMPL implant 1 each by Subdermal route once.   Yes Historical Provider, MD  HYDROcodone-acetaminophen (NORCO) 5-325 MG tablet Take 1 tablet by mouth every 6 (six) hours as  needed for moderate pain. Patient not taking: Reported on 10/28/2016 08/30/15   Jennye Moccasin, MD  ibuprofen (ADVIL,MOTRIN) 600 MG tablet Take 1 tablet (600 mg total) by mouth every 6 (six) hours as needed. 10/28/16   Antony Madura, PA-C  omeprazole (PRILOSEC) 20 MG capsule Take 1 capsule (20 mg total) by mouth daily. 10/28/16   Antony Madura, PA-C  ondansetron (ZOFRAN ODT) 4 MG disintegrating tablet 4mg  ODT q4 hours prn nausea/vomit Patient not taking: Reported on 10/28/2016 08/27/15   Bethann Berkshire, MD  traMADol (ULTRAM) 50 MG tablet Take 1 tablet (50 mg total) by mouth every 6 (six) hours as needed for severe pain. 10/28/16   Antony Madura, PA-C    Family History History reviewed. No pertinent family history.  Social History Social History  Substance Use Topics  . Smoking status: Never Smoker  . Smokeless tobacco: Never Used  . Alcohol use No     Allergies   Patient has no known allergies.   Review of Systems Review of Systems Ten systems reviewed and are negative for acute change, except as noted in the HPI.    Physical Exam Updated Vital Signs BP 123/89   Pulse (!) 101   Temp 98.1 F (36.7 C) (Oral)   Resp 16   LMP 10/20/2016   SpO2 98%   Physical Exam  Constitutional: She is oriented to person,  place, and time. She appears well-developed and well-nourished. No distress.  Obese female in NAD  HENT:  Head: Normocephalic and atraumatic.  Eyes: Conjunctivae and EOM are normal. No scleral icterus.  Neck: Normal range of motion.  Cardiovascular: Regular rhythm and intact distal pulses.   Mild tachycardia  Pulmonary/Chest: Effort normal. No respiratory distress. She has no wheezes. She has no rales.  Respirations even and unlabored. Mild left lower chest wall TTP. Lungs CTAB.  Abdominal: Soft. She exhibits no distension and no mass. There is tenderness.  Soft, obese abdomen with mild LUQ TTP. No masses palpated. No peritoneal signs.  Musculoskeletal: Normal range of motion.    Neurological: She is alert and oriented to person, place, and time. She exhibits normal muscle tone. Coordination normal.  GCS 15. Patient moving all extremities.  Skin: Skin is warm and dry. No rash noted. She is not diaphoretic. No erythema. No pallor.  Psychiatric: She has a normal mood and affect. Her behavior is normal.  Nursing note and vitals reviewed.    ED Treatments / Results  Labs (all labs ordered are listed, but only abnormal results are displayed) Labs Reviewed  COMPREHENSIVE METABOLIC PANEL - Abnormal; Notable for the following:       Result Value   Glucose, Bld 100 (*)    All other components within normal limits  CBC - Abnormal; Notable for the following:    Hemoglobin 10.1 (*)    HCT 33.0 (*)    MCH 24.0 (*)    RDW 17.3 (*)    All other components within normal limits  URINALYSIS, ROUTINE W REFLEX MICROSCOPIC - Abnormal; Notable for the following:    APPearance HAZY (*)    All other components within normal limits  LIPASE, BLOOD  PREGNANCY, URINE  D-DIMER, QUANTITATIVE (NOT AT [facility])    EKG  EKG Interpretation None       Radiology No results found.  Procedures Procedures (including critical care time)  Medications Ordered in ED Medications  gi cocktail (Maalox,Lidocaine,Donnatal) (30 mLs Oral Given 10/28/16 0002)  acetaminophen (TYLENOL) tablet 1,000 mg (1,000 mg Oral Given 10/28/16 0002)  traMADol (ULTRAM) tablet 50 mg (50 mg Oral Given 10/28/16 0213)     Initial Impression / Assessment and Plan / ED Course  I have reviewed the triage vital signs and the nursing notes.  Pertinent labs & imaging results that were available during my care of the patient were reviewed by me and considered in my medical decision making (see chart for details).  Clinical Course as of Oct 29 651  Wed Oct 28, 2016  0202 Hemoglobin: (!) 10.1 [KH]    Clinical Course User Index [KH] Antony MaduraKelly Lysa Livengood, PA-C    26 year old female presents to the emergency department for  evaluation of left upper quadrant abdominal pain. She believes that her pain is associated with a mass to her abdomen, but abdomen appears symmetric and no masses palpable. Tenderness does mildly extend to the chest wall. Question musculoskeletal etiology. Patient does report heavy lifting recently at her job. She states that she works in [employer] business and lifted a heavy cream of wine.  Symptoms thought less likely to gastritis given lack of improvement with GI cocktail. Laboratory workup has been reviewed and is reassuring. There is no leukocytosis. No fever. D-dimer ordered given tachycardia. This is negative. Patient reports a history of tachycardia. Given lack of hypoxia, low suspicion for PE. Will plan for supportive management with ibuprofen. Short course of tramadol given for severe  pain. Patient has been having "issues with reflux lately". Prilosec prescribed for this reason. Return precautions discussed and provided. Patient discharged in stable condition with no unaddressed concerns.   Final Clinical Impressions(s) / ED Diagnoses   Final diagnoses:  Left upper quadrant pain    New Prescriptions Discharge Medication List as of 10/28/2016  2:08 AM    START taking these medications   Details  ibuprofen (ADVIL,MOTRIN) 600 MG tablet Take 1 tablet (600 mg total) by mouth every 6 (six) hours as needed., Starting Wed 10/28/2016, Print    omeprazole (PRILOSEC) 20 MG capsule Take 1 capsule (20 mg total) by mouth daily., Starting Wed 10/28/2016, Print    traMADol (ULTRAM) 50 MG tablet Take 1 tablet (50 mg total) by mouth every 6 (six) hours as needed for severe pain., Starting Wed 10/28/2016, Print         [place], PA-C 11/01/16 1610    Pricilla Loveless, MD 11/02/16 1036

## 2016-11-04 ENCOUNTER — Other Ambulatory Visit: Payer: Self-pay | Admitting: Student

## 2016-11-04 DIAGNOSIS — R1012 Left upper quadrant pain: Secondary | ICD-10-CM

## 2016-11-09 ENCOUNTER — Ambulatory Visit
Admission: RE | Admit: 2016-11-09 | Discharge: 2016-11-09 | Disposition: A | Discharge status: Home / Self Care | Payer: BLUE CROSS/BLUE SHIELD | Source: Ambulatory Visit | Attending: Student | Admitting: Student

## 2016-11-09 DIAGNOSIS — R1012 Left upper quadrant pain: Secondary | ICD-10-CM | POA: Diagnosis present

## 2016-11-09 DIAGNOSIS — R161 Splenomegaly, not elsewhere classified: Secondary | ICD-10-CM | POA: Insufficient documentation

## 2017-09-23 ENCOUNTER — Emergency Department (HOSPITAL_COMMUNITY)
Admission: EM | Admit: 2017-09-23 | Discharge: 2017-09-23 | Disposition: A | Discharge status: Home / Self Care | Payer: 59 | Attending: Emergency Medicine | Admitting: Emergency Medicine

## 2017-09-23 ENCOUNTER — Emergency Department (HOSPITAL_COMMUNITY): Payer: 59

## 2017-09-23 ENCOUNTER — Encounter (HOSPITAL_COMMUNITY): Payer: Self-pay

## 2017-09-23 ENCOUNTER — Other Ambulatory Visit: Payer: Self-pay

## 2017-09-23 DIAGNOSIS — J189 Pneumonia, unspecified organism: Secondary | ICD-10-CM | POA: Insufficient documentation

## 2017-09-23 DIAGNOSIS — Z79899 Other long term (current) drug therapy: Secondary | ICD-10-CM | POA: Insufficient documentation

## 2017-09-23 DIAGNOSIS — J181 Lobar pneumonia, unspecified organism: Secondary | ICD-10-CM

## 2017-09-23 DIAGNOSIS — R05 Cough: Secondary | ICD-10-CM | POA: Diagnosis present

## 2017-09-23 LAB — POC URINE PREG, ED: Preg Test, Ur: NEGATIVE

## 2017-09-23 LAB — INFLUENZA PANEL BY PCR (TYPE A & B)
Influenza A By PCR: NEGATIVE
Influenza B By PCR: NEGATIVE

## 2017-09-23 MED ORDER — BENZONATATE 100 MG PO CAPS: 100.0000 mg | ORAL_CAPSULE | Freq: Three times a day (TID) | ORAL | 0 refills | Status: DC

## 2017-09-23 MED ORDER — ALBUTEROL SULFATE HFA 108 (90 BASE) MCG/ACT IN AERS
2.0000 | INHALATION_SPRAY | Freq: Once | RESPIRATORY_TRACT | Status: AC
Start: 2017-09-23 — End: 2017-09-23
  Administered 2017-09-23: 2 via RESPIRATORY_TRACT

## 2017-09-23 MED ORDER — ALBUTEROL SULFATE HFA 108 (90 BASE) MCG/ACT IN AERS
INHALATION_SPRAY | RESPIRATORY_TRACT | Status: AC
  Filled 2017-09-23: qty 6.7

## 2017-09-23 NOTE — ED Provider Notes (Signed)
Patient placed in Quick Look pathway, seen and evaluated   Chief Complaint: cough  HPI:   Patient dx with pneumonia 2 weeks ago and started on cefdinir. Patient reports getting better but then over the past few days symptoms got worse again. Patient denies fever or chills but can't stop coughing.   ROS: cough            No fever or chills  Physical Exam:   Gen: No distress  Neuro: Awake and Alert  Skin: Warm    Focused Exam:    Initiation of care has begun. The patient has been counseled on the process, plan, and necessity for staying for the completion/evaluation, and the remainder of the medical screening examination    Janne Napoleoneese, Tessy Pawelski M, NP 09/23/17 1329    Mancel BaleWentz, Elliott, MD 09/24/17 [phone]

## 2017-09-23 NOTE — ED Triage Notes (Signed)
Pt dx with PNA 2 weeks ago and was placed on antibiotics and felt better at the end of last week and felt 80% better, but over the last couple days her cough has come back worse than before.

## 2017-09-23 NOTE — Discharge Instructions (Signed)
Your chest x-ray showed improving pneumonia from last visit.  Please finish your antibiotics to completion.  I have tested you for the flu today.  Please follow-up with your primary care provider in regards to these results.  I am re-prescribing you Tessalon to help you with your cough.  Please take as prescribed.  Please follow attached handout on pneumonia.  Follow  return precautions on handout.

## 2017-09-23 NOTE — ED Provider Notes (Addendum)
[facility] EMERGENCY DEPARTMENT Provider Note   CSN: [id number] Arrival date & time: 09/23/17  1238     History   Chief Complaint Chief Complaint  Patient presents with  . Pneumonia    HPI Mary Dickerson is a 27 y.o. female no significant past medical history who presents the emergency department today for continued cough.  Patient symptoms initially began on January.  She was seen at [facility] on 08/30/17 with complaints of cough, minimal sinus congestion, fatigue and aches.  She was diagnosed with bronchitis with bronchospasms and discharged home on azithromycin, Tessalon and prednisone.  Patient was then seen again by primary care on 2/7 improvement of her symptoms but then developed headache, body aches with continued cough.  Patient was diagnosed with flulike symptoms and was noted to be outside of the beneficial window for Tamiflu.  She was discharged home with doxycycline and promethazine DM.  Patient reports that on 2/11 she was seen at [facility] where she had a chest x-ray which showed a left lower lobe pneumonia and started on cefdinir. She has been taking it as prescribed. Has 3 days left. The patient notes that her symptoms improved but she has continued cough and can't stop coughing. There is no associated fever, chest pain, sputum production, hemoptysis, lower leg swelling with this. Patient is not on an ACE-I. She notes that she was discharged with codeine with cough syrup but does not feel that helped her symptoms.  There is no associated body aches, myalgias, arthralgias, abdominal pain, nausea/vomiting/diarrhea.  She denies sick contacts.    HPI  Past Medical History:  Diagnosis Date  . Medical history non-contributory     Patient Active Problem List   Diagnosis Date Noted  . Status post normal vaginal delivery 02/09/2014  . Pregnancy 02/08/2014    Past Surgical History:  Procedure Laterality Date  . ESOPHAGEAL DILATION    .  ESOPHAGOGASTRODUODENOSCOPY N/A 09/07/2014   Procedure: ESOPHAGOGASTRODUODENOSCOPY (EGD);  Surgeon: Barrie Folk, MD;  Location: [facility] ENDOSCOPY;  Service: Endoscopy;  Laterality: N/A;  . HIP ARTHROPLASTY    . NO PAST SURGERIES      OB History    Gravida Para Term Preterm AB Living   1 1 1     1    SAB TAB Ectopic Multiple Live Births           1       Home Medications    Prior to Admission medications   Medication Sig Start Date End Date Taking? Authorizing Provider  etonogestrel (NEXPLANON) 68 MG IMPL implant 1 each by Subdermal route once.    [provider]  HYDROcodone-acetaminophen (NORCO) 5-325 MG tablet Take 1 tablet by mouth every 6 (six) hours as needed for moderate pain. Patient not taking: Reported on 10/28/2016 08/30/15   Jennye Moccasin, MD  ibuprofen (ADVIL,MOTRIN) 600 MG tablet Take 1 tablet (600 mg total) by mouth every 6 (six) hours as needed. 10/28/16   Antony Madura, PA-C  omeprazole (PRILOSEC) 20 MG capsule Take 1 capsule (20 mg total) by mouth daily. 10/28/16   Antony Madura, PA-C  ondansetron (ZOFRAN ODT) 4 MG disintegrating tablet 4mg  ODT q4 hours prn nausea/vomit Patient not taking: Reported on 10/28/2016 08/27/15   Bethann Berkshire, MD  traMADol (ULTRAM) 50 MG tablet Take 1 tablet (50 mg total) by mouth every 6 (six) hours as needed for severe pain. 10/28/16   Antony Madura, PA-C    Family History History reviewed. No pertinent  family history.  Social History Social History   Tobacco Use  . Smoking status: Never Smoker  . Smokeless tobacco: Never Used  Substance Use Topics  . Alcohol use: No  . Drug use: No     Allergies   Patient has no known allergies.   Review of Systems Review of Systems  All other systems reviewed and are negative.    Physical Exam Updated Vital Signs BP 133/90 (BP Location: Right Arm)   Pulse (!) 105   Temp 98.7 F (37.1 C) (Oral)   Resp 16   LMP 09/16/2017 (Exact Date)   SpO2 99%   Physical Exam  Constitutional:  She appears well-developed and well-nourished.  HENT:  Head: Normocephalic and atraumatic.  Right Ear: Tympanic membrane and external ear normal.  Left Ear: Tympanic membrane and external ear normal.  Nose: Mucosal edema present. Right sinus exhibits no maxillary sinus tenderness and no frontal sinus tenderness. Left sinus exhibits no maxillary sinus tenderness and no frontal sinus tenderness.  Mouth/Throat: Uvula is midline, oropharynx is clear and moist and mucous membranes are normal. No tonsillar exudate.  The patient has normal phonation and is in control of secretions. No stridor.  Midline uvula without edema. Soft palate rises symmetrically.  No tonsillar erythema or exudates. No PTA. Tongue protrusion is normal. No trismus. No creptius on neck palpation and patient has good dentition.  Mucus membranes moist.   Eyes: Pupils are equal, round, and reactive to light. Right eye exhibits no discharge. Left eye exhibits no discharge. No scleral icterus.  Neck: Trachea normal. Neck supple. No JVD present. No spinous process tenderness present. Carotid bruit is not present. No neck rigidity. Normal range of motion present.  No nuchal rigidity or meningismus  Cardiovascular: Normal rate, regular rhythm and intact distal pulses.  No murmur heard. Pulses:      Radial pulses are 2+ on the right side, and 2+ on the left side.       Dorsalis pedis pulses are 2+ on the right side, and 2+ on the left side.       Posterior tibial pulses are 2+ on the right side, and 2+ on the left side.  No lower extremity swelling or edema. Calves symmetric in size bilaterally.  Pulmonary/Chest: Effort normal and breath sounds normal. She exhibits no tenderness.  Abdominal: Soft. Bowel sounds are normal. There is no tenderness. There is no rebound and no guarding.  Musculoskeletal: She exhibits no edema.  Lymphadenopathy:    She has no cervical adenopathy.  Neurological: She is alert.  Skin: Skin is warm and dry. No  rash noted. She is not diaphoretic.  Psychiatric: She has a normal mood and affect.  Nursing note and vitals reviewed.    ED Treatments / Results  Labs (all labs ordered are listed, but only abnormal results are displayed) Labs Reviewed  INFLUENZA PANEL BY PCR (TYPE A & B)  POC URINE PREG, ED    EKG  EKG Interpretation None       Radiology Dg Chest 2 View  Result Date: 09/23/2017 CLINICAL DATA:  Chest tightness and cough. EXAM: CHEST  2 VIEW COMPARISON:  09/13/2017 FINDINGS: Right lung clear. Airspace disease seen previously at the left base has improved but not completely at resolved. The cardiopericardial silhouette is within normal limits for size. The visualized bony structures of the thorax are intact. IMPRESSION: Marked interval improvement without complete resolution of left basilar airspace disease seen on the prior study. Electronically Signed   By:  Kennith CenterEric  Mansell M.D.   On: 09/23/2017 13:43    Procedures Procedures (including critical care time)  Medications Ordered in ED Medications - No data to display   Initial Impression / Assessment and Plan / ED Course  I have reviewed the triage vital signs and the nursing notes.  Pertinent labs & imaging results that were available during my care of the patient were reviewed by me and considered in my medical decision making (see chart for details).     27 year old female resenting for continued cough.  She was seen on 1/28 and diagnosed with bronchitis and started on azithromycin and prednisone.  She was seen again on 2/7 where she was diagnosed with flulike symptoms and discharged home with doxycycline and promethazine DM.  [facility]11 where she was diagnosed with pneumonia of the left lower lobe.  She was prescribed Cefdinir has been taking as prescribed 3 days left.  She notes improving symptoms but continued cough.  She is without fever, chest pain, SOB, lower leg swelling or hemoptysis. Vital  signs are reassuring without fever, tachypnea, hypoxia or hypotension. Mild tachycardia noted at 105 but patient notes just had caffeinated bevarge PTA. The patient notes that she has previously been on codeine cough syrup and Tessalon.  She has run out of both.  Do not feel the codeine cough syrup helped. Notes the Tessalon did. Will refill the Tessalon. Patient to continue course of antibiotic to completion. Xray appears to having improving infiltrate from prior. Do not feel she needs change in antibiotic. She is satting at 99%, without signs of increased work of breathing. Do not feel she needs inpatient treatment at this time. Will test the patient for the flu to see if this is causing patient's symptoms to persist and make her not feel well. She is to follow up with PCP in regards to these results. She is outside of Tamiflu treatment window. I advised the patient to follow-up with PCP in the next 72 hours. Specific return precautions discussed. Time was given for all questions to be answered. The patient verbalized understanding and agreement with plan. The patient appears safe for discharge home.  Final Clinical Impressions(s) / ED Diagnoses   Final diagnoses:  Community acquired pneumonia of left lower lobe of lung [facility](HCC)    ED Discharge Orders        Ordered    benzonatate (TESSALON) 100 MG capsule  Every 8 hours     09/23/17 1528       Princella PellegriniMaczis, Hobie Kohles M, PA-C 09/23/17 1530    Sallie Staron, Elmer SowMichael M, PA-C 09/23/17 1544    Margarita Grizzleay, Danielle, MD 09/24/17 1728

## 2018-08-05 ENCOUNTER — Emergency Department (HOSPITAL_COMMUNITY): Payer: Self-pay

## 2018-08-05 ENCOUNTER — Other Ambulatory Visit: Payer: Self-pay

## 2018-08-05 ENCOUNTER — Encounter (HOSPITAL_COMMUNITY): Payer: Self-pay | Admitting: Emergency Medicine

## 2018-08-05 ENCOUNTER — Emergency Department (HOSPITAL_COMMUNITY)
Admission: EM | Admit: 2018-08-05 | Discharge: 2018-08-05 | Disposition: A | Discharge status: Home / Self Care | Payer: Self-pay | Attending: Emergency Medicine | Admitting: Emergency Medicine

## 2018-08-05 DIAGNOSIS — J101 Influenza due to other identified influenza virus with other respiratory manifestations: Secondary | ICD-10-CM | POA: Insufficient documentation

## 2018-08-05 DIAGNOSIS — Z96649 Presence of unspecified artificial hip joint: Secondary | ICD-10-CM | POA: Insufficient documentation

## 2018-08-05 DIAGNOSIS — Z79899 Other long term (current) drug therapy: Secondary | ICD-10-CM | POA: Insufficient documentation

## 2018-08-05 LAB — INFLUENZA PANEL BY PCR (TYPE A & B)
INFLAPCR: NEGATIVE
Influenza B By PCR: POSITIVE — AB

## 2018-08-05 LAB — GROUP A STREP BY PCR: GROUP A STREP BY PCR: NOT DETECTED

## 2018-08-05 MED ORDER — NAPROXEN 500 MG PO TABS: 500.0000 mg | ORAL_TABLET | Freq: Two times a day (BID) | ORAL | 0 refills | Status: AC

## 2018-08-05 MED ORDER — FLUTICASONE PROPIONATE 50 MCG/ACT NA SUSP: 1.0000 | Freq: Every day | NASAL | 0 refills | Status: AC

## 2018-08-05 MED ORDER — ACETAMINOPHEN 325 MG PO TABS
650.0000 mg | ORAL_TABLET | Freq: Once | ORAL | Status: AC | PRN
  Administered 2018-08-05: 650 mg via ORAL
  Filled 2018-08-05: qty 2

## 2018-08-05 MED ORDER — BENZONATATE 100 MG PO CAPS
100.0000 mg | ORAL_CAPSULE | Freq: Three times a day (TID) | ORAL | 0 refills | Status: AC | PRN
Start: 2018-08-05 — End: ?

## 2018-08-05 MED ORDER — KETOROLAC TROMETHAMINE 60 MG/2ML IM SOLN
60.0000 mg | Freq: Once | INTRAMUSCULAR | Status: AC
  Administered 2018-08-05: 60 mg via INTRAMUSCULAR
  Filled 2018-08-05: qty 2

## 2018-08-05 NOTE — Discharge Instructions (Addendum)
You were seen in the emergency today for upper respiratory symptoms, we suspect your symptoms are related to allergies or a virus at this time.  I have prescribed you multiple medications to treat your symptoms.   -Flonase to be used 1 spray in each nostril daily.  This medication is used to treat your congestion.  -Tessalon can be taken once every 8 hours as needed.  This medication is used to treat your cough.  -- Naproxen is a nonsteroidal anti-inflammatory medication that will help with pain and swelling. Be sure to take this medication as prescribed with food, 1 pill every 12 hours,  It should be taken with food, as it can cause stomach upset, and more seriously, stomach bleeding. Do not take other nonsteroidal anti-inflammatory medications with this such as Advil, Motrin, Aleve, Mobic, Goodie Powder, or Motrin.     You make take Tylenol per over the counter dosing with these medications.   We have prescribed you new medication(s) today. Discuss the medications prescribed today with your pharmacist as they can have adverse effects and interactions with your other medicines including over the counter and prescribed medications. Seek medical evaluation if you start to experience new or abnormal symptoms after taking one of these medicines, seek care immediately if you start to experience difficulty breathing, feeling of your throat closing, facial swelling, or rash as these could be indications of a more serious allergic reaction  You are contagious, be sure to participate in good hand hygiene.  Drink plenty of fluids.   You will need to follow-up with your primary care provider in 1 week if your symptoms have not improved.  If you do not have a primary care provider one is provided in your discharge instructions.  Return to the emergency department for any new or worsening symptoms including but not limited to persistent fever for 5 days, difficulty breathing, chest pain, rashes, passing out, or  any other concerns.

## 2018-08-05 NOTE — ED Provider Notes (Signed)
[facility]   CSN: [id number] Arrival date & time: 08/05/18  [id number]     History   Chief Complaint Chief Complaint  Patient presents with  . Shortness of Breath  . URI    HPI Mary Dickerson is a 28 y.o. female with a history of prior pneumonia who presents to the emergency department with complaints of URI symptoms for the past 24 hours.  Patient states that she initially developed congestion, sore throat, and productive cough with green mucus sputum.  She resultantly then started to feel that her chest was congested, tight, and she had some trouble breathing.  She states her chest hurts, especially when she is coughing. She states that she has had some subjective fevers at home. No specific alleviating/aggravating factors. No intervention PTA. Denies vomiting, diarrhea, ear pain, palpitations,  leg pain/swelling, hemoptysis, recent surgery/trauma, recent long travel, hormone use, personal hx of cancer, or hx of DVT/PE. Denies chance of pregnancy.    HPI  Past Medical History:  Diagnosis Date  . Medical history non-contributory     Patient Active Problem List   Diagnosis Date Noted  . Status post normal vaginal delivery 02/09/2014  . Pregnancy 02/08/2014    Past Surgical History:  Procedure Laterality Date  . ESOPHAGEAL DILATION    . ESOPHAGOGASTRODUODENOSCOPY N/A 09/07/2014   Procedure: ESOPHAGOGASTRODUODENOSCOPY (EGD);  Surgeon: Barrie FolkJohn C Hayes, MD;  [facility];  Service: Endoscopy;  Laterality: N/A;  . HIP ARTHROPLASTY    . NO PAST SURGERIES       OB History    Gravida  1   Para  1   Term  1   Preterm      AB      Living  1     SAB      TAB      Ectopic      Multiple      Live Births  1            Home Medications    Prior to Admission medications   Medication Sig Start Date End Date Taking? Authorizing Provider  benzonatate (TESSALON) 100 MG capsule Take 1 capsule (100 mg total) by  mouth every 8 (eight) hours. 09/23/17   Maczis, Elmer SowMichael M, PA-C  etonogestrel (NEXPLANON) 68 MG IMPL implant 1 each by Subdermal route once.    [provider]  HYDROcodone-acetaminophen (NORCO) 5-325 MG tablet Take 1 tablet by mouth every 6 (six) hours as needed for moderate pain. Patient not taking: Reported on 10/28/2016 08/30/15   Jennye MoccasinQuigley, Brian S, MD  ibuprofen (ADVIL,MOTRIN) 600 MG tablet Take 1 tablet (600 mg total) by mouth every 6 (six) hours as needed. 10/28/16   Antony MaduraHumes, Kelly, PA-C  omeprazole (PRILOSEC) 20 MG capsule Take 1 capsule (20 mg total) by mouth daily. 10/28/16   Antony MaduraHumes, Kelly, PA-C  ondansetron (ZOFRAN ODT) 4 MG disintegrating tablet 4mg  ODT q4 hours prn nausea/vomit Patient not taking: Reported on 10/28/2016 08/27/15   Bethann BerkshireZammit, Joseph, MD  traMADol (ULTRAM) 50 MG tablet Take 1 tablet (50 mg total) by mouth every 6 (six) hours as needed for severe pain. 10/28/16   Antony MaduraHumes, Kelly, PA-C    Family History No family history on file.  Social History Social History   Tobacco Use  . Smoking status: Never Smoker  . Smokeless tobacco: Never Used  Substance Use Topics  . Alcohol use: No  . Drug use: No     Allergies  Patient has no known allergies.   Review of Systems Review of Systems  Constitutional: Positive for fever.  HENT: Positive for congestion and sore throat. Negative for ear pain.   Respiratory: Positive for cough, chest tightness and shortness of breath.   Cardiovascular: Positive for chest pain.  Gastrointestinal: Negative for abdominal pain, diarrhea and vomiting.  Genitourinary: Negative for dysuria.     Physical Exam Updated Vital Signs BP 130/83 (BP Location: Right Arm)   Pulse (!) 113   Temp (!) 102 F (38.9 C) (Oral)   Resp 20   Ht 5' (1.524 m)   Wt 99.8 kg   LMP 07/14/2018 (Exact Date)   SpO2 98%   BMI 42.97 kg/m   Physical Exam Vitals signs and nursing note reviewed.  Constitutional:      General: She is not in acute distress.     Appearance: Normal appearance. She is well-developed. She is not ill-appearing or toxic-appearing.  HENT:     Head: Normocephalic and atraumatic.     Right Ear: Tympanic membrane and ear canal normal. Tympanic membrane is not perforated, erythematous, retracted or bulging.     Left Ear: Tympanic membrane and ear canal normal. Tympanic membrane is not perforated, erythematous, retracted or bulging.     Nose: Mucosal edema present.     Mouth/Throat:     Mouth: Mucous membranes are moist.     Pharynx: Uvula midline. Posterior oropharyngeal erythema present. No oropharyngeal exudate.     Tonsils: Swelling: 2+ on the right. 2+ on the left.     Comments: Posterior oropharynx is symmetric appearing. Patient tolerating own secretions without difficulty. No trismus. No drooling. No hot potato voice. No swelling beneath the tongue, submandibular compartment is soft.  Eyes:     General:        Right eye: No discharge.        Left eye: No discharge.     Conjunctiva/sclera: Conjunctivae normal.     Pupils: Pupils are equal, round, and reactive to light.  Neck:     Musculoskeletal: Normal range of motion and neck supple. No neck rigidity.  Cardiovascular:     Rate and Rhythm: Regular rhythm. Tachycardia present.     Heart sounds: No murmur.  Pulmonary:     Effort: Pulmonary effort is normal. No tachypnea or respiratory distress.     Breath sounds: Normal breath sounds. No wheezing, rhonchi or rales.  Chest:     Chest wall: Tenderness (to anterior chest wall) present. No deformity, swelling, crepitus or edema.     Comments: Patient states chest wall palpation reproduces her chest pain/tightness.  Abdominal:     General: There is no distension.     Palpations: Abdomen is soft.     Tenderness: There is no abdominal tenderness.  Lymphadenopathy:     Cervical: No cervical adenopathy.  Skin:    General: Skin is warm and dry.     Findings: No rash.  Neurological:     Mental Status: She is alert.    Psychiatric:        Behavior: Behavior normal.      ED Treatments / Results  Labs (all labs ordered are listed, but only abnormal results are displayed) Labs Reviewed  INFLUENZA PANEL BY PCR (TYPE A & B) - Abnormal; Notable for the following components:      Result Value   Influenza B By PCR POSITIVE (*)    All other components within normal limits  GROUP A STREP BY PCR  EKG None  Radiology Dg Chest 2 View  Result Date: 08/05/2018 CLINICAL DATA:  Cough. EXAM: CHEST - 2 VIEW COMPARISON:  Radiographs of September 23, 2017. FINDINGS: The heart size and mediastinal contours are within normal limits. Both lungs are clear. No pneumothorax or pleural effusion is noted. The visualized skeletal structures are unremarkable. IMPRESSION: No active cardiopulmonary disease. Electronically Signed   By: Lupita Raider, M.D.   On: 08/05/2018 08:16    Procedures Procedures (including critical care time)  Medications Ordered in ED Medications  acetaminophen (TYLENOL) tablet 650 mg (650 mg Oral Given 08/05/18 0720)     Initial Impression / Assessment and Plan / ED Course  I have reviewed the triage vital signs and the nursing notes.  Pertinent labs & imaging results that were available during my care of the patient were reviewed by me and considered in my medical decision making (see chart for details).   Patient presents to the ED with URI sxs. Febrile w/ likely resultant tachycardia on arrival. Lungs CTA- CXR negative for infiltrate- doubt pneumonia. < 7 days of sxs, doubt acute bacterial sinusitis. Strep negative, no evidence of RPA/PTA. HR normalized with antipyretics, chest pain reproducible w/ palpation, no respiratory distress noted- doubt PE. Influenza B positive- likely etiology. Discussed option of tamiflu, no comorbid conditions to indicate necessity, denies chance of pregnancy, patient declined tamiflu. Will treat symptomatically. I discussed results, treatment plan, need for  follow-up, and return precautions with the patient. Provided opportunity for questions, patient confirmed understanding and is in agreement with plan.   Vitals:   08/05/18 0706 08/05/18 1014  BP: 130/83 119/78  Pulse: (!) 113 92  Resp: 20 16  Temp: (!) 102 F (38.9 C) 98.3 F (36.8 C)  SpO2: 98% 100%    Final Clinical Impressions(s) / ED Diagnoses   Final diagnoses:  Influenza B    ED Discharge Orders         Ordered    fluticasone (FLONASE) 50 MCG/ACT nasal spray  Daily     08/05/18 1024    benzonatate (TESSALON) 100 MG capsule  3 times daily PRN     08/05/18 1024    naproxen (NAPROSYN) 500 MG tablet  2 times daily     08/05/18 1024           Raden Byington, [place] R, PA-C 08/05/18 1026    Alvira Monday, MD 08/06/18 1041

## 2018-08-05 NOTE — ED Triage Notes (Signed)
Pt reports she got SOB yesterday and congested. Pt reports a cough that produces yellowish sputum.

## 2018-08-05 NOTE — ED Notes (Signed)
Pt tolerated fluids well.  

## 2019-05-12 IMAGING — DX DG CHEST 2V
2 series · 2 of 2 positions shown · non-contrast
Comparison: Radiographs September 23, 2017.

CLINICAL DATA: Cough.

EXAM:
CHEST - 2 VIEW

[w chest pa]
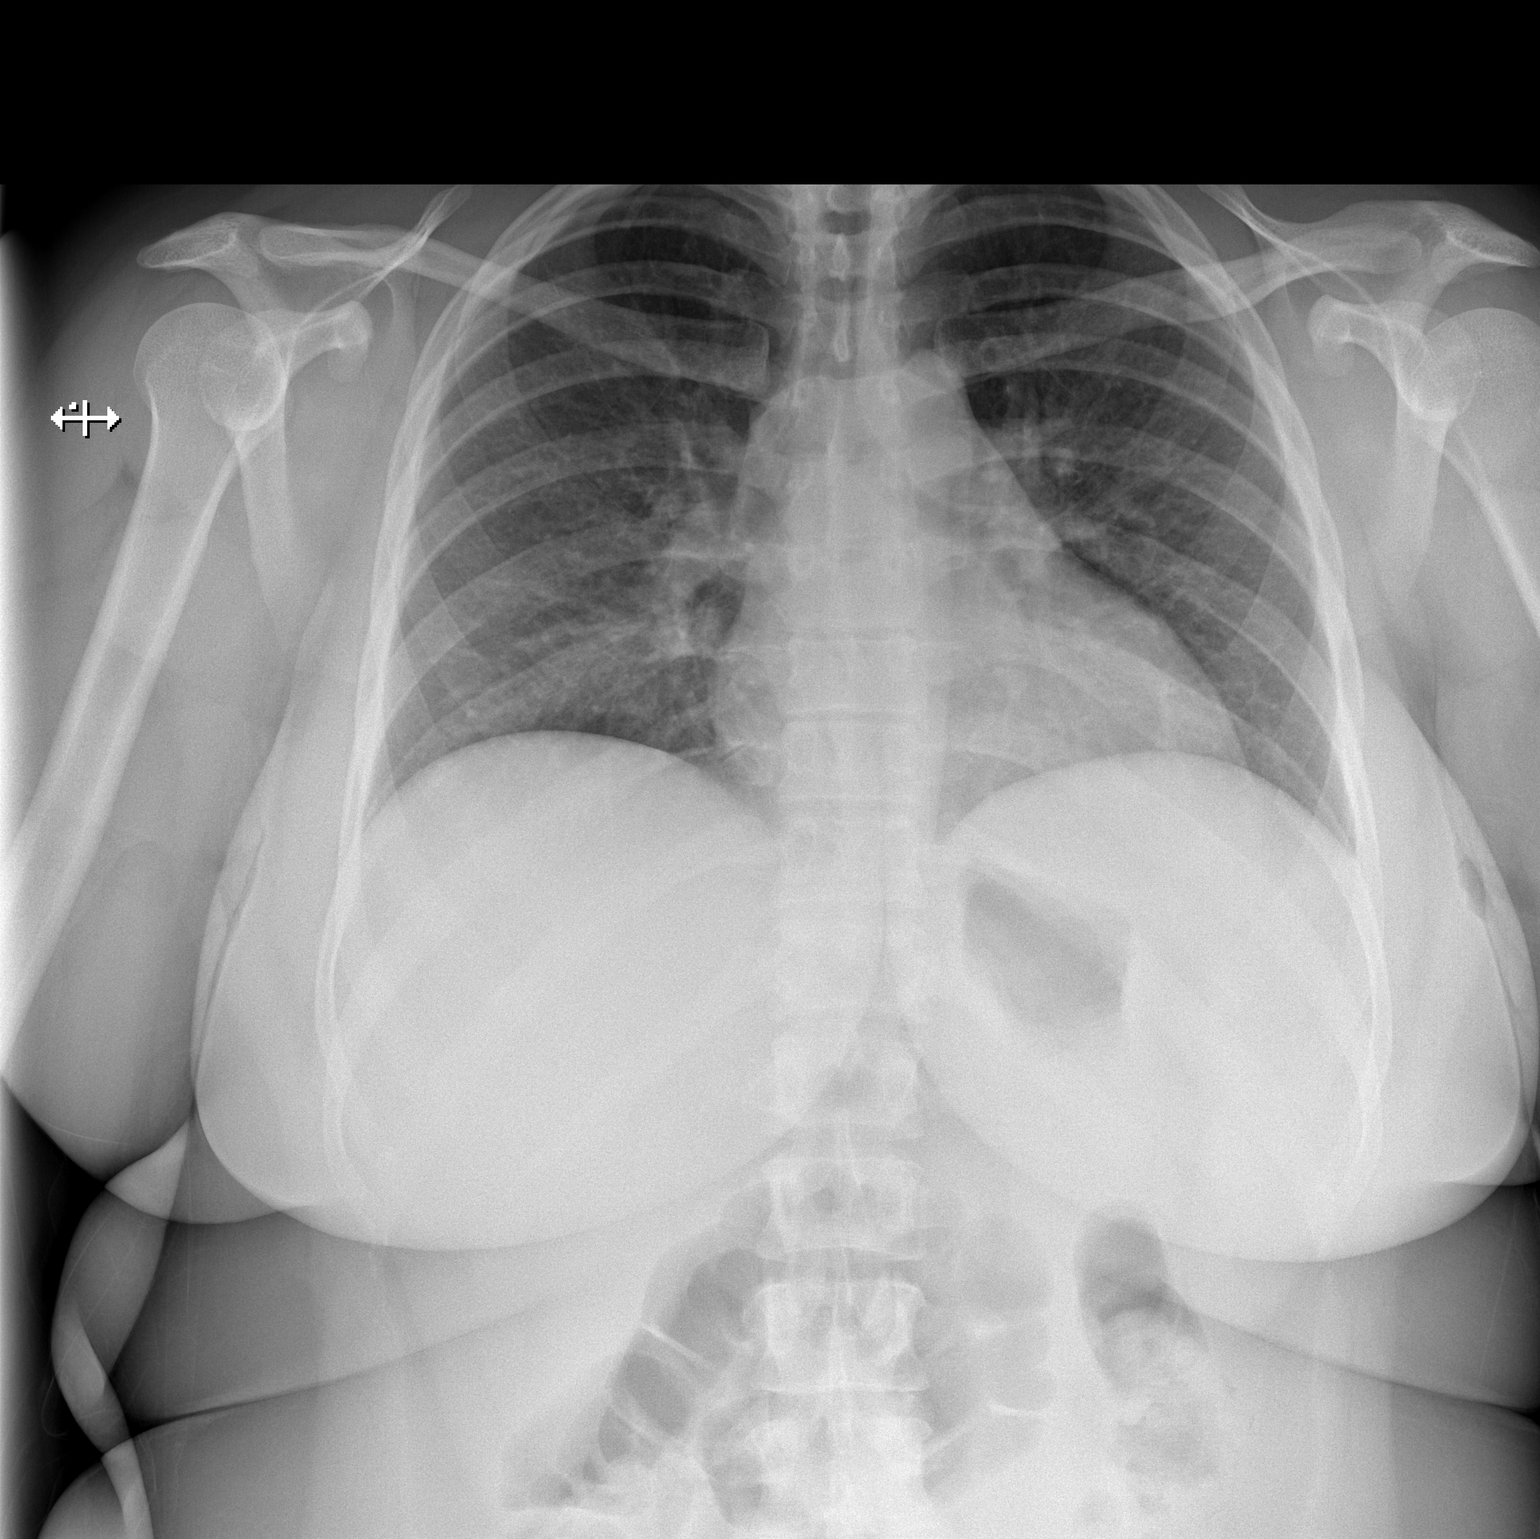

[w chest lat]
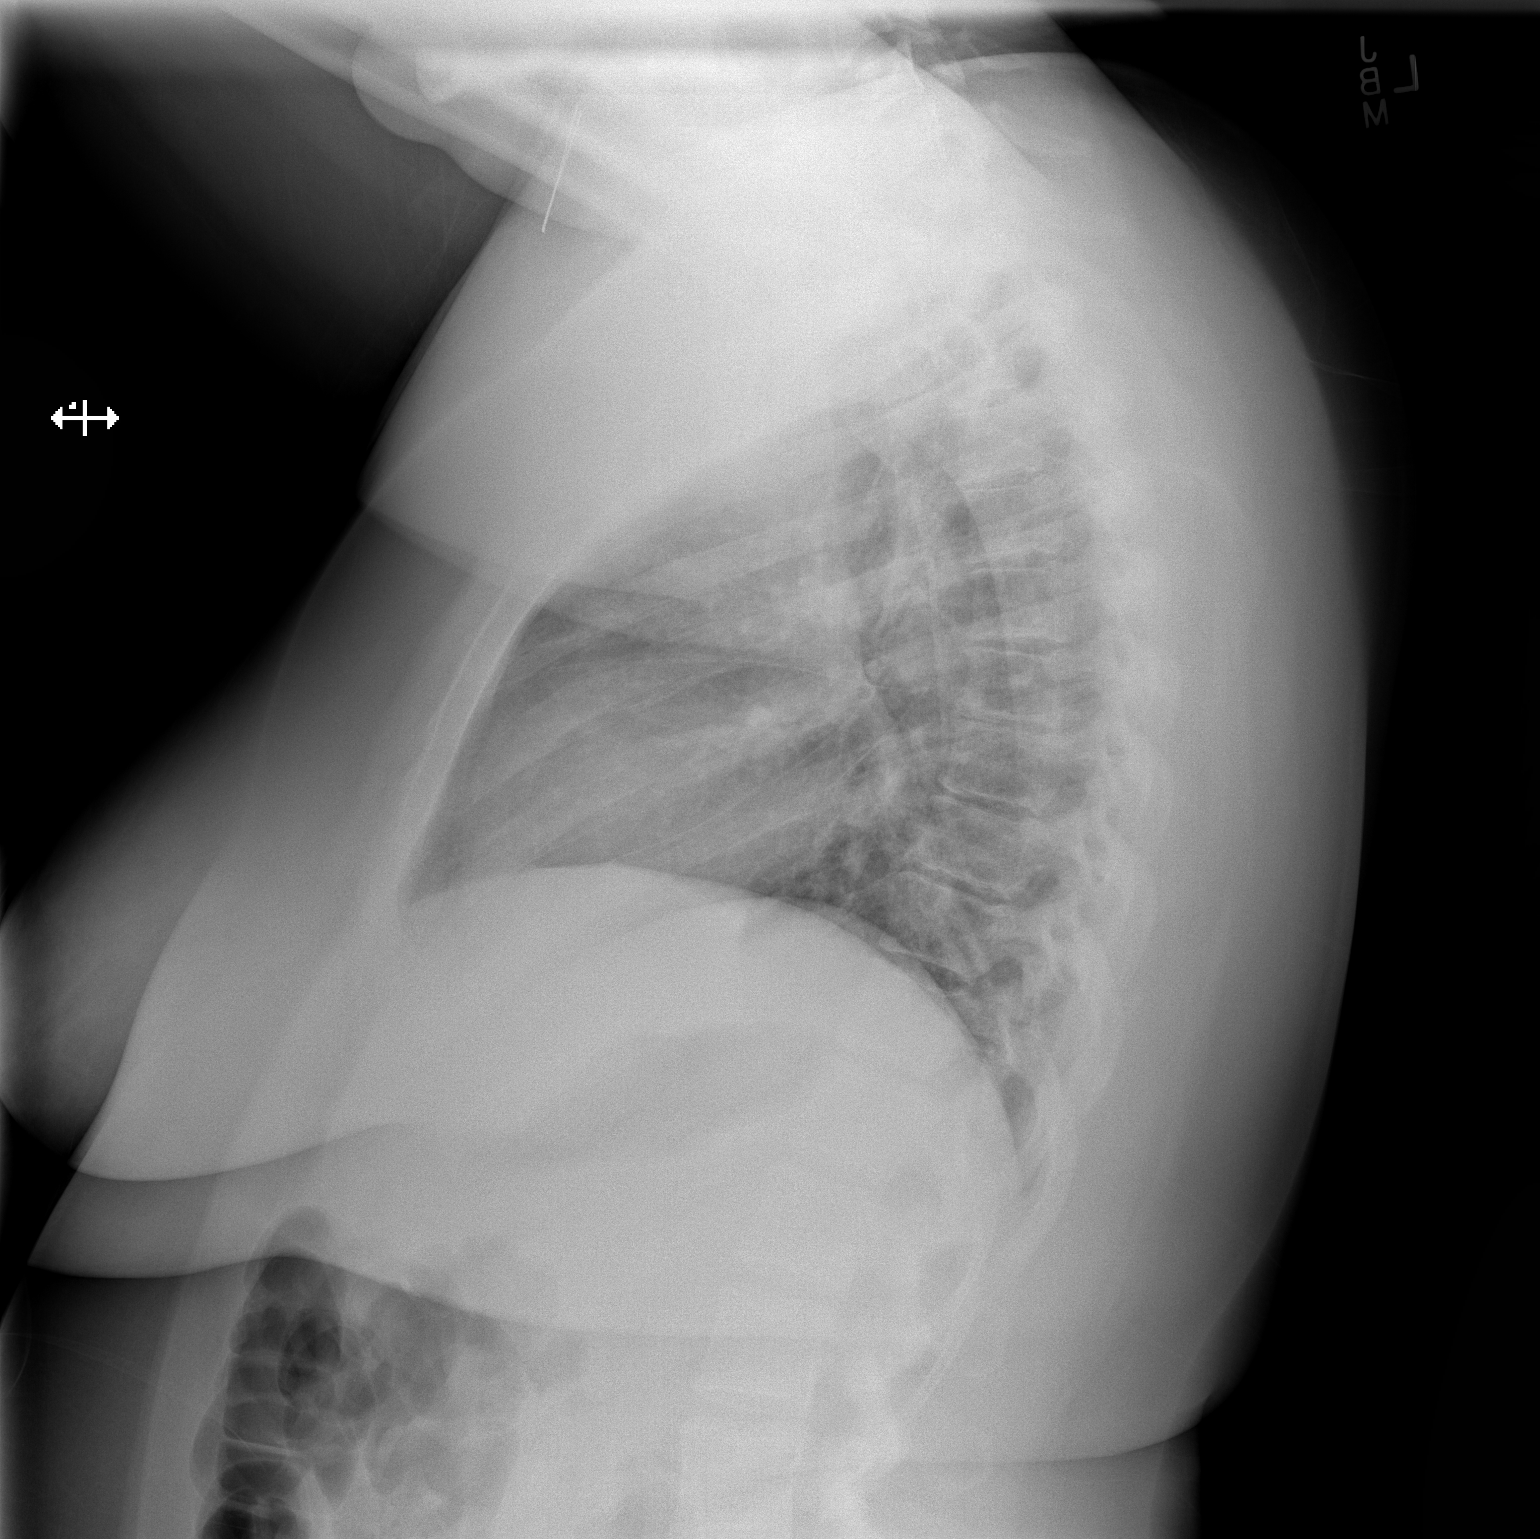

[2 of 2 positions shown; findings below may reference images not displayed]

FINDINGS: The heart size and mediastinal contours are within normal limits.
Both lungs are clear. No pneumothorax or pleural effusion is noted.
The visualized skeletal structures are unremarkable.
IMPRESSION: No active cardiopulmonary disease.
